# Patient Record
Sex: Female | Born: 1955
Health system: Southern US, Community
[De-identification: ages and names within clinical notes are randomized; demographics above are authoritative.]

## PROBLEM LIST (undated history)

## (undated) DIAGNOSIS — M810 Age-related osteoporosis without current pathological fracture: Secondary | ICD-10-CM

## (undated) DIAGNOSIS — K219 Gastro-esophageal reflux disease without esophagitis: Secondary | ICD-10-CM

## (undated) DIAGNOSIS — T7840XA Allergy, unspecified, initial encounter: Secondary | ICD-10-CM

## (undated) DIAGNOSIS — M1990 Unspecified osteoarthritis, unspecified site: Secondary | ICD-10-CM

## (undated) DIAGNOSIS — I10 Essential (primary) hypertension: Secondary | ICD-10-CM

## (undated) DIAGNOSIS — I219 Acute myocardial infarction, unspecified: Secondary | ICD-10-CM

## (undated) DIAGNOSIS — E785 Hyperlipidemia, unspecified: Secondary | ICD-10-CM

## (undated) DIAGNOSIS — G4730 Sleep apnea, unspecified: Secondary | ICD-10-CM

## (undated) DIAGNOSIS — C801 Malignant (primary) neoplasm, unspecified: Secondary | ICD-10-CM

## (undated) HISTORY — PX: TONSILLECTOMY: SUR1361

## (undated) HISTORY — DX: Allergy, unspecified, initial encounter: T78.40XA

## (undated) HISTORY — DX: Sleep apnea, unspecified: G47.30

## (undated) HISTORY — PX: DILATION AND CURETTAGE OF UTERUS: SHX78

## (undated) HISTORY — DX: Malignant (primary) neoplasm, unspecified: C80.1

## (undated) HISTORY — PX: FRACTURE SURGERY: SHX138

## (undated) HISTORY — DX: Unspecified osteoarthritis, unspecified site: M19.90

## (undated) HISTORY — DX: Age-related osteoporosis without current pathological fracture: M81.0

## (undated) HISTORY — DX: Gastro-esophageal reflux disease without esophagitis: K21.9

## (undated) HISTORY — PX: SKIN CANCER EXCISION: SHX779

---
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
## 1997-02-10 HISTORY — PX: LEEP: SHX91

## 2004-06-18 ENCOUNTER — Ambulatory Visit: Payer: Self-pay | Admitting: Family Medicine

## 2006-03-19 ENCOUNTER — Ambulatory Visit: Payer: Self-pay | Admitting: Family Medicine

## 2006-05-07 ENCOUNTER — Ambulatory Visit: Payer: Self-pay | Admitting: Gastroenterology

## 2007-03-23 ENCOUNTER — Ambulatory Visit: Payer: Self-pay | Admitting: Family Medicine

## 2008-10-31 ENCOUNTER — Ambulatory Visit: Payer: Self-pay | Admitting: Family Medicine

## 2009-02-10 HISTORY — PX: CARDIAC CATHETERIZATION: SHX172

## 2009-11-06 ENCOUNTER — Ambulatory Visit: Payer: Self-pay

## 2010-01-16 ENCOUNTER — Emergency Department: Payer: Self-pay | Admitting: Emergency Medicine

## 2010-01-16 ENCOUNTER — Ambulatory Visit: Payer: Self-pay | Admitting: Internal Medicine

## 2010-01-16 DIAGNOSIS — I252 Old myocardial infarction: Secondary | ICD-10-CM | POA: Insufficient documentation

## 2010-02-12 ENCOUNTER — Ambulatory Visit: Payer: Self-pay | Admitting: Internal Medicine

## 2010-12-10 ENCOUNTER — Ambulatory Visit: Payer: Self-pay | Admitting: Family Medicine

## 2011-12-17 ENCOUNTER — Ambulatory Visit: Payer: Self-pay | Admitting: Family Medicine

## 2013-01-27 ENCOUNTER — Ambulatory Visit: Payer: Self-pay | Admitting: Family Medicine

## 2014-04-12 ENCOUNTER — Ambulatory Visit: Payer: Self-pay | Admitting: Family Medicine

## 2014-08-09 DIAGNOSIS — O039 Complete or unspecified spontaneous abortion without complication: Secondary | ICD-10-CM | POA: Insufficient documentation

## 2014-08-09 DIAGNOSIS — E7800 Pure hypercholesterolemia, unspecified: Secondary | ICD-10-CM | POA: Insufficient documentation

## 2014-08-09 DIAGNOSIS — K921 Melena: Secondary | ICD-10-CM | POA: Insufficient documentation

## 2014-08-09 DIAGNOSIS — M1990 Unspecified osteoarthritis, unspecified site: Secondary | ICD-10-CM | POA: Insufficient documentation

## 2014-08-09 DIAGNOSIS — R195 Other fecal abnormalities: Secondary | ICD-10-CM | POA: Insufficient documentation

## 2014-08-09 DIAGNOSIS — I25118 Atherosclerotic heart disease of native coronary artery with other forms of angina pectoris: Secondary | ICD-10-CM | POA: Insufficient documentation

## 2014-08-09 DIAGNOSIS — E559 Vitamin D deficiency, unspecified: Secondary | ICD-10-CM | POA: Insufficient documentation

## 2014-08-09 DIAGNOSIS — E669 Obesity, unspecified: Secondary | ICD-10-CM | POA: Insufficient documentation

## 2014-08-09 DIAGNOSIS — L659 Nonscarring hair loss, unspecified: Secondary | ICD-10-CM | POA: Insufficient documentation

## 2014-08-09 DIAGNOSIS — I10 Essential (primary) hypertension: Secondary | ICD-10-CM | POA: Insufficient documentation

## 2014-08-09 DIAGNOSIS — I2510 Atherosclerotic heart disease of native coronary artery without angina pectoris: Secondary | ICD-10-CM | POA: Insufficient documentation

## 2014-11-17 ENCOUNTER — Ambulatory Visit (INDEPENDENT_AMBULATORY_CARE_PROVIDER_SITE_OTHER): Payer: 59 | Admitting: Physician Assistant

## 2014-11-17 ENCOUNTER — Encounter: Payer: Self-pay | Admitting: Physician Assistant

## 2014-11-17 VITALS — BP 102/60 | HR 78 | Temp 98.2°F | Resp 16 | Wt 153.8 lb

## 2014-11-17 DIAGNOSIS — G479 Sleep disorder, unspecified: Secondary | ICD-10-CM | POA: Insufficient documentation

## 2014-11-17 DIAGNOSIS — I5181 Takotsubo syndrome: Secondary | ICD-10-CM | POA: Diagnosis not present

## 2014-11-17 DIAGNOSIS — E7800 Pure hypercholesterolemia, unspecified: Secondary | ICD-10-CM | POA: Insufficient documentation

## 2014-11-17 DIAGNOSIS — I10 Essential (primary) hypertension: Secondary | ICD-10-CM | POA: Diagnosis not present

## 2014-11-17 DIAGNOSIS — Z23 Encounter for immunization: Secondary | ICD-10-CM

## 2014-11-17 DIAGNOSIS — I209 Angina pectoris, unspecified: Secondary | ICD-10-CM | POA: Insufficient documentation

## 2014-11-17 DIAGNOSIS — Z8679 Personal history of other diseases of the circulatory system: Secondary | ICD-10-CM | POA: Insufficient documentation

## 2014-11-17 DIAGNOSIS — R0789 Other chest pain: Secondary | ICD-10-CM | POA: Insufficient documentation

## 2014-11-17 MED ORDER — SIMVASTATIN 40 MG PO TABS: 20.0000 mg | ORAL_TABLET | Freq: Every day | ORAL | Status: DC

## 2014-11-17 NOTE — Progress Notes (Signed)
Patient: Katherine White Female    DOB: [birth year]   59 y.o.   MRN: [id number] Visit Date: 11/17/2014  Today's Provider: Mar Daring, PA-C   Chief Complaint  Patient presents with  . Hypertension  . Hyperlipidemia   Subjective:    HPI  Hypertension, follow-up:  BP Readings from Last 3 Encounters:  11/17/14 102/60  05/16/14 100/62    She was last seen for hypertension 6 months ago.  BP at that visit was 100/62. Management since that visit includes none. She reports good compliance with treatment. She is not having side effects.  She is exercising, walks 3-4 times a week. She is adherent to low salt diet.   Outside blood pressures are has not checked. She is experiencing none.  Patient denies none.        Weight trend: fluctuating a bit Wt Readings from Last 3 Encounters:  11/17/14 153 lb 12.8 oz (69.763 kg)  05/16/14 149 lb 12.8 oz (67.949 kg)    Current diet: in general, a "healthy" diet    She also is here today to follow-up her hypercholesterolemia. Labs in April were within normal limits. She is currently taking simvastatin 40 mg. She is taking half a tab nightly.  She also has a history of Takotsubo cardiomyopathy. She denies any chest pain, shortness of breath, dyspnea on exertion, orthopnea and lower extremity edema. She is currently controlled on carvedilol and losartan. Blood pressure today was stable. She is followed by Dr. Edd Arbour cardiology. She was seen last week for her annual visit. Everything was normal.  She also has a complaint of trouble sleeping. She was given Ambien 5 mg by Dr. Clayborn Bigness, which she did not fill. She is interested in trying to improve her sleep hygiene and take natural sleep aids to see if this will help her with her sleep before taking a prescription sleep aid. She states her biggest complaint is difficulty falling asleep. She states her thoughts, or worrying and stress are reasons why she can't fall  asleep. She also states that she use to have discomfort in her arms and legs where she could not get comfortable. She has bought a new pillow and states that this is decreasing but that it is still present on some nights. She has been going through a lot of situational stress recently. Her mother-in-law all recently passed away on 2022-10-27, she has been helping to take care of her father-in-law since this. She also keeps her grandson who is 18 months old. As well as having her father in failing health and she constantly worries about him as well. ------------------------------------------------------------------------      Allergies  Allergen Reactions  . Aspirin     GI upset (High dosage) Can take 81 mg  . Codeine    Previous Medications   ASPIRIN 81 MG TABLET    Take 1 tablet by mouth daily.   AZELAIC ACID 15 % CREAM    1 application daily.   CARVEDILOL (COREG) 3.125 MG TABLET    Take 1 tablet by mouth daily.   CHOLECALCIFEROL (VITAMIN D) 2000 UNITS CAPS    Take 1 capsule by mouth daily.   IVERMECTIN (SOOLANTRA) 1 % CREA    Apply topically once daily.   LORATADINE (CLARITIN REDITABS) 10 MG DISSOLVABLE TABLET    Take 1 tablet by mouth daily.   LOSARTAN (COZAAR) 25 MG TABLET    Take 1 tablet by mouth daily.   MAGNESIUM CITRATE 100  MG TABS    Take 1 tablet by mouth daily.   METRONIDAZOLE (METROGEL) 1 % GEL    1 application daily.   MULTIPLE VITAMIN PO    Take 1 tablet by mouth daily.   NITROGLYCERIN (NITROSTAT) 0.4 MG SL TABLET    Place 1 tablet under the tongue as needed.   PYRIDOXINE (B6 NATURAL) 100 MG TABLET    Take 1 tablet by mouth daily.   SIMVASTATIN (ZOCOR) 40 MG TABLET    Take 0.5 tablets by mouth daily.   TETRAHYDROZOLINE-ZINC (EYE DROPS RELIEF) 0.05-0.25 % OPHTHALMIC SOLUTION    Apply 1 drop to eye daily.   ZOLPIDEM (AMBIEN) 5 MG TABLET    Take by mouth.    Review of Systems  Constitutional: Negative.   HENT: Negative.   Eyes: Negative.   Respiratory: Negative.     Cardiovascular: Negative for chest pain, palpitations and leg swelling.  Gastrointestinal: Negative.   Endocrine: Negative.   Genitourinary: Negative.   Musculoskeletal: Positive for myalgias (occasional at night) and neck stiffness (improving with new pillow). Negative for back pain and arthralgias.  Skin: Color change: sometimes.  Allergic/Immunologic: Negative.   Neurological: Negative.   Hematological: Negative.   Psychiatric/Behavioral: Positive for sleep disturbance.    Social History  Substance Use Topics  . Smoking status: Former Smoker -- 1 years  . Smokeless tobacco: Not on file  . Alcohol Use: 0.0 oz/week    0 Standard drinks or equivalent per week     Comment: OCCASIONALLY   Objective:   BP 102/60 mmHg  Pulse 78  Temp(Src) 98.2 F (36.8 C) (Oral)  Resp 16  Wt 153 lb 12.8 oz (69.763 kg)  Physical Exam  Constitutional: She is oriented to person, place, and time. She appears well-developed and well-nourished. No distress.  Cardiovascular: Normal rate, regular rhythm and normal heart sounds.  Exam reveals no gallop and no friction rub.   No murmur heard. Pulmonary/Chest: Effort normal and breath sounds normal. No respiratory distress. She has no wheezes. She has no rales.  Neurological: She is alert and oriented to person, place, and time.  Skin: She is not diaphoretic.  Psychiatric: She has a normal mood and affect. Her behavior is normal. Judgment and thought content normal.  Vitals reviewed.       Assessment & Plan:     1. Hypercholesterolemia without hypertriglyceridemia Labs in April were stable. She is to continue the simvastatin in 40 mg taking a half tab nightly. I will recheck lipid panel. Follow-up pending results or in 6 months with her annual physical exam if labs are normal. - Lipid panel - CBC with Differential/Platelet - simvastatin (ZOCOR) 40 MG tablet; Take 0.5 tablets (20 mg total) by mouth daily.  Dispense: 30 tablet; Refill: 6  2.  Takotsubo cardiomyopathy Followed by Dr. Edd Arbour cardiology.  Currently stable on Cardizem and losartan. I will check labs and follow-up pending labs or in 6 months with her annual physical exam if her labs are stable. - CBC with Differential/Platelet - Comprehensive metabolic panel  3. Essential hypertension Blood pressures are normal. I will check labs and follow-up pending labs or in 6 months at her annual physical exam if labs are stable. - CBC with Differential/Platelet - Comprehensive metabolic panel  4. Trouble in sleeping She is had trouble sleeping for a while but just now brings it up in the office visit today since she has not had much improvement. She was given Ambien 5 mg by Dr. Clayborn Bigness. She did  not fill this prescription. We discussed in detail sleep hygiene and sleep meditation techniques. She is going to try some of these methods and see if they help her sleep. We also discussed the use of melatonin. She may try the melatonin. I also advised her that melatonin may take a while for it to be effective so if she tries it to continue to take it for 2-4 weeks to see if it helps before discontinuing. She is to call the office if she has any worsening insomnia. If not I will see her in 6 months with her annual physical and we will discuss it at that visit.  5. Need for influenza vaccination Flu vaccine was given today without complication. - Flu Vaccine QUAD 36+ mos IM       Mar Daring, PA-C  [place]

## 2014-11-17 NOTE — Patient Instructions (Signed)
Insomnia Insomnia is a sleep disorder that makes it difficult to fall asleep or to stay asleep. Insomnia can cause tiredness (fatigue), low energy, difficulty concentrating, mood swings, and poor performance at work or school.  There are three different ways to classify insomnia:  Difficulty falling asleep.  Difficulty staying asleep.  Waking up too early in the morning. Any type of insomnia can be long-term (chronic) or short-term (acute). Both are common. Short-term insomnia usually lasts for three months or less. Chronic insomnia occurs at least three times a week for longer than three months. CAUSES  Insomnia may be caused by another condition, situation, or substance, such as:  Anxiety.  Certain medicines.  Gastroesophageal reflux disease (GERD) or other gastrointestinal conditions.  Asthma or other breathing conditions.  Restless legs syndrome, sleep apnea, or other sleep disorders.  Chronic pain.  Menopause. This may include hot flashes.  Stroke.  Abuse of alcohol, tobacco, or illegal drugs.  Depression.  Caffeine.   Neurological disorders, such as Alzheimer disease.  An overactive thyroid (hyperthyroidism). The cause of insomnia may not be known. RISK FACTORS Risk factors for insomnia include:  Gender. Women are more commonly affected than men.  Age. Insomnia is more common as you get older.  Stress. This may involve your professional or personal life.  Income. Insomnia is more common in people with lower income.  Lack of exercise.   Irregular work schedule or night shifts.  Traveling between different time zones. SIGNS AND SYMPTOMS If you have insomnia, trouble falling asleep or trouble staying asleep is the main symptom. This may lead to other symptoms, such as:  Feeling fatigued.  Feeling nervous about going to sleep.  Not feeling rested in the morning.  Having trouble concentrating.  Feeling irritable, anxious, or depressed. TREATMENT    Treatment for insomnia depends on the cause. If your insomnia is caused by an underlying condition, treatment will focus on addressing the condition. Treatment may also include:   Medicines to help you sleep.  Counseling or therapy.  Lifestyle adjustments. HOME CARE INSTRUCTIONS   Take medicines only as directed by your health care provider.  Keep regular sleeping and waking hours. Avoid naps.  Keep a sleep diary to help you and your health care provider figure out what could be causing your insomnia. Include:   When you sleep.  When you wake up during the night.  How well you sleep.   How rested you feel the next day.  Any side effects of medicines you are taking.  What you eat and drink.   Make your bedroom a comfortable place where it is easy to fall asleep:  Put up shades or special blackout curtains to block light from outside.  Use a white noise machine to block noise.  Keep the temperature cool.   Exercise regularly as directed by your health care provider. Avoid exercising right before bedtime.  Use relaxation techniques to manage stress. Ask your health care provider to suggest some techniques that may work well for you. These may include:  Breathing exercises.  Routines to release muscle tension.  Visualizing peaceful scenes.  Cut back on alcohol, caffeinated beverages, and cigarettes, especially close to bedtime. These can disrupt your sleep.  Do not overeat or eat spicy foods right before bedtime. This can lead to digestive discomfort that can make it hard for you to sleep.  Limit screen use before bedtime. This includes:  Watching TV.  Using your smartphone, tablet, and computer.  Stick to a routine.  This can help you fall asleep faster. Try to do a quiet activity, brush your teeth, and go to bed at the same time each night.  Get out of bed if you are still awake after 15 minutes of trying to sleep. Keep the lights down, but try reading or  doing a quiet activity. When you feel sleepy, go back to bed.  Make sure that you drive carefully. Avoid driving if you feel very sleepy.  Keep all follow-up appointments as directed by your health care provider. This is important. SEEK MEDICAL CARE IF:   You are tired throughout the day or have trouble in your daily routine due to sleepiness.  You continue to have sleep problems or your sleep problems get worse. SEEK IMMEDIATE MEDICAL CARE IF:   You have serious thoughts about hurting yourself or someone else.   This information is not intended to replace advice given to you by your health care provider. Make sure you discuss any questions you have with your health care provider.   Document Released: 01/25/2000 Document Revised: 10/18/2014 Document Reviewed: 10/28/2013 Elsevier Interactive Patient Education 2016 [place].  High Cholesterol High cholesterol refers to having a high level of cholesterol in your blood. Cholesterol is a white, waxy, fat-like protein that your body needs in small amounts. Your liver makes all the cholesterol you need. Excess cholesterol comes from the food you eat. Cholesterol travels in your bloodstream through your blood vessels. If you have high cholesterol, deposits (plaque) may build up on the walls of your blood vessels. This makes the arteries narrower and stiffer. Plaque increases your risk of heart attack and stroke. Work with your health care provider to keep your cholesterol levels in a healthy range. RISK FACTORS Several things can make you more likely to have high cholesterol. These include:   Eating foods high in animal fat (saturated fat) or cholesterol.  Being overweight.  Not getting enough exercise.  Having a family history of high cholesterol. SIGNS AND SYMPTOMS High cholesterol does not cause symptoms. DIAGNOSIS  Your health care provider can do a blood test to check whether you have high cholesterol. If you are older than 20,  your health care provider may check your cholesterol every 4-6 years. You may be checked more often if you already have high cholesterol or other risk factors for heart disease. The blood test for cholesterol measures the following:  Bad cholesterol (LDL cholesterol). This is the type of cholesterol that causes heart disease. This number should be less than 100.  Good cholesterol (HDL cholesterol). This type helps protect against heart disease. A healthy level of HDL cholesterol is 60 or higher.  Total cholesterol. This is the combined number of LDL cholesterol and HDL cholesterol. A healthy number is less than 200. TREATMENT  High cholesterol can be treated with diet changes, lifestyle changes, and medicine.   Diet changes may include eating more whole grains, fruits, vegetables, nuts, and fish. You may also have to cut back on red meat and foods with a lot of added sugar.  Lifestyle changes may include getting at least 40 minutes of aerobic exercise three times a week. Aerobic exercises include walking, biking, and swimming. Aerobic exercise along with a healthy diet can help you maintain a healthy weight. Lifestyle changes may also include quitting smoking.  If diet and lifestyle changes are not enough to lower your cholesterol, your health care provider may prescribe a statin medicine. This medicine has been shown to lower cholesterol and  also lower the risk of heart disease. HOME CARE INSTRUCTIONS  Only take over-the-counter or prescription medicines as directed by your health care provider.   Follow a healthy diet as directed by your health care provider. For instance:   Eat chicken (without skin), fish, veal, shellfish, ground [place] breast, and round or loin cuts of red meat.  Do not eat fried foods and fatty meats, such as hot dogs and salami.   Eat plenty of fruits, such as apples.   Eat plenty of vegetables, such as broccoli, potatoes, and carrots.   Eat beans, peas, and  lentils.   Eat grains, such as barley, rice, couscous, and bulgur wheat.   Eat pasta without cream sauces.   Use skim or nonfat milk and low-fat or nonfat yogurt and cheeses. Do not eat or drink whole milk, cream, ice cream, egg yolks, and hard cheeses.   Do not eat stick margarine or tub margarines that contain trans fats (also called partially hydrogenated oils).   Do not eat cakes, cookies, crackers, or other baked goods that contain trans fats.   Do not eat saturated tropical oils, such as coconut and palm oil.   Exercise as directed by your health care provider. Increase your activity level with activities such as gardening or walking.   Keep all follow-up appointments.  SEEK MEDICAL CARE IF:  You are struggling to maintain a healthy diet or weight.  You need help starting an exercise program.  You need help to stop smoking. SEEK IMMEDIATE MEDICAL CARE IF:  You have chest pain.  You have trouble breathing.   This information is not intended to replace advice given to you by your health care provider. Make sure you discuss any questions you have with your health care provider.   Document Released: 01/27/2005 Document Revised: 02/17/2014 Document Reviewed: 11/19/2012 Elsevier Interactive Patient Education [employer].

## 2014-11-18 LAB — CBC WITH DIFFERENTIAL/PLATELET
BASOS: 1 %
Basophils Absolute: 0.1 10*3/uL (ref 0.0–0.2)
EOS (ABSOLUTE): 0.3 10*3/uL (ref 0.0–0.4)
EOS: 4 %
HEMOGLOBIN: 16 g/dL — AB (ref 11.1–15.9)
Hematocrit: 48 % — ABNORMAL HIGH (ref 34.0–46.6)
IMMATURE GRANS (ABS): 0 10*3/uL (ref 0.0–0.1)
Immature Granulocytes: 0 %
LYMPHS: 32 %
Lymphocytes Absolute: 2.3 10*3/uL (ref 0.7–3.1)
MCH: 28.8 pg (ref 26.6–33.0)
MCHC: 33.3 g/dL (ref 31.5–35.7)
MCV: 87 fL (ref 79–97)
MONOCYTES: 8 %
Monocytes Absolute: 0.6 10*3/uL (ref 0.1–0.9)
NEUTROS ABS: 4 10*3/uL (ref 1.4–7.0)
Neutrophils: 55 %
Platelets: 242 10*3/uL (ref 150–379)
RBC: 5.55 x10E6/uL — ABNORMAL HIGH (ref 3.77–5.28)
RDW: 14 % (ref 12.3–15.4)
WBC: 7.1 10*3/uL (ref 3.4–10.8)

## 2014-11-18 LAB — LIPID PANEL
CHOLESTEROL TOTAL: 172 mg/dL (ref 100–199)
Chol/HDL Ratio: 2.8 ratio units (ref 0.0–4.4)
HDL: 62 mg/dL (ref >39)
LDL Calculated: 89 mg/dL (ref 0–99)
TRIGLYCERIDES: 106 mg/dL (ref 0–149)
VLDL Cholesterol Cal: 21 mg/dL (ref 5–40)

## 2014-11-18 LAB — COMPREHENSIVE METABOLIC PANEL WITH GFR
ALT: 35 IU/L — ABNORMAL HIGH (ref 0–32)
AST: 21 IU/L (ref 0–40)
Albumin/Globulin Ratio: 2.2 (ref 1.1–2.5)
Albumin: 4.6 g/dL (ref 3.5–5.5)
Alkaline Phosphatase: 97 IU/L (ref 39–117)
BUN/Creatinine Ratio: 18 (ref 9–23)
BUN: 14 mg/dL (ref 6–24)
Bilirubin Total: 0.4 mg/dL (ref 0.0–1.2)
CO2: 22 mmol/L (ref 18–29)
Calcium: 9.8 mg/dL (ref 8.7–10.2)
Chloride: 105 mmol/L (ref 97–108)
Creatinine, Ser: 0.77 mg/dL (ref 0.57–1.00)
GFR calc Af Amer: 98 mL/min/1.73 (ref >59)
GFR calc non Af Amer: 85 mL/min/1.73 (ref >59)
Globulin, Total: 2.1 g/dL (ref 1.5–4.5)
Glucose: 94 mg/dL (ref 65–99)
Potassium: 4.6 mmol/L (ref 3.5–5.2)
Sodium: 142 mmol/L (ref 134–144)
Total Protein: 6.7 g/dL (ref 6.0–8.5)

## 2014-11-20 ENCOUNTER — Telehealth: Payer: Self-pay

## 2014-11-20 NOTE — Telephone Encounter (Signed)
-----   Message from Mar Daring, PA-C sent at 11/20/2014  9:38 AM EDT ----- All labs are stable and WNL.

## 2014-11-20 NOTE — Telephone Encounter (Signed)
Advised pt as directed. Pt verbalized fully understanding.  Thanks,   

## 2015-01-17 ENCOUNTER — Other Ambulatory Visit: Payer: Self-pay | Admitting: Physician Assistant

## 2015-01-17 DIAGNOSIS — E7800 Pure hypercholesterolemia, unspecified: Secondary | ICD-10-CM

## 2015-02-28 ENCOUNTER — Telehealth: Payer: Self-pay | Admitting: Physician Assistant

## 2015-02-28 DIAGNOSIS — J069 Acute upper respiratory infection, unspecified: Secondary | ICD-10-CM

## 2015-02-28 MED ORDER — AZITHROMYCIN 250 MG PO TABS: ORAL_TABLET | ORAL | Status: DC

## 2015-02-28 NOTE — Telephone Encounter (Signed)
Please review. Thanks!  

## 2015-02-28 NOTE — Telephone Encounter (Signed)
Will try Zpak. Sent to [place]. If no improvement she will need to be seen.  Thanks.

## 2015-02-28 NOTE — Telephone Encounter (Signed)
Pt states she has had a sore throat, ears stopped up and sinus congestion with thick white mucus/a little green for 2 weeks.  Pt has been taking over the counter medication.  Pt is requesting a Rx if possible.  Pt takes care of has her 26 month old grandson and does not want to bring him in the office.  [place].  [id number]

## 2015-02-28 NOTE — Telephone Encounter (Signed)
Advised patient as below.  

## 2015-05-18 ENCOUNTER — Encounter: Payer: 59 | Admitting: Physician Assistant

## 2015-06-05 ENCOUNTER — Encounter: Payer: Self-pay | Admitting: Physician Assistant

## 2015-06-05 ENCOUNTER — Ambulatory Visit (INDEPENDENT_AMBULATORY_CARE_PROVIDER_SITE_OTHER): Payer: 59 | Admitting: Physician Assistant

## 2015-06-05 VITALS — BP 100/70 | HR 72 | Temp 97.6°F | Resp 16 | Ht 60.0 in | Wt 154.4 lb

## 2015-06-05 DIAGNOSIS — E7800 Pure hypercholesterolemia, unspecified: Secondary | ICD-10-CM

## 2015-06-05 DIAGNOSIS — E559 Vitamin D deficiency, unspecified: Secondary | ICD-10-CM | POA: Diagnosis not present

## 2015-06-05 DIAGNOSIS — I10 Essential (primary) hypertension: Secondary | ICD-10-CM | POA: Diagnosis not present

## 2015-06-05 DIAGNOSIS — Z0000 Encounter for general adult medical examination without abnormal findings: Secondary | ICD-10-CM | POA: Diagnosis not present

## 2015-06-05 DIAGNOSIS — Z1239 Encounter for other screening for malignant neoplasm of breast: Secondary | ICD-10-CM | POA: Diagnosis not present

## 2015-06-05 NOTE — Patient Instructions (Signed)

## 2015-06-05 NOTE — Progress Notes (Signed)
Patient: Katherine White, Female    DOB: 1955/07/14, 60 y.o.   MRN: [id number] Visit Date: 06/05/2015  Today's Provider: Mar Daring, PA-C   Chief Complaint  Patient presents with  . Annual Exam   Subjective:    Annual physical exam Katherine White is a 60 y.o. female who presents today for health maintenance and complete physical. She feels fairly well. She reports exercising-Pilates and Yoga 5 x a week. She reports she is sleeping well at least six hours. She have had some stress since the last time she saw [place]. Her mother-in-law passed away in 2022-10-17 and her Daddy passed away this month on the 18-May-2022.   Lipid/Cholesterol, Follow-up:   Last seen for this 6 months ago.  Management changes since that visit include none.To continue Simvastatin 40 mg-takes 1/2 tab nightly.She is followed by Dr. Edd Arbour cardiology. He prescribes her BP and cholesterol medications. . Last Lipid Panel:    Component Value Date/Time   CHOL 172 11/17/2014 0938   TRIG 106 11/17/2014 0938   HDL 62 11/17/2014 0938   CHOLHDL 2.8 11/17/2014 0938   LDLCALC 89 11/17/2014 0938    Risk factors for vascular disease include hypertension and Cardiovascular and Mediastinum, Takotsubo cardiomyopathy.  She reports good compliance with treatment. She is not having side effects.  Current symptoms include none and have been stable. Weight trend: stable Current exercise: Yoga and Pilates.  Wt Readings from Last 3 Encounters:  06/05/15 154 lb 6.4 oz (70.035 kg)  11/17/14 153 lb 12.8 oz (69.763 kg)  05/16/14 149 lb 12.8 oz (67.949 kg)    ------------------------------------------------------------------- Takotsubo Cardiomyopathy: Patient is followed by Dr. Edd Arbour, [place]: She is here for 6 months follow-up on her sleep. Last visit she was having trouble falling asleep and waking up during the night. She is not taking anything for sleep. She  is doing better and states she has good nights and bad nights. She does feel that since her father passed she has not been worrying about him so that has helped some. She is getting about 6 hours of sleep.  Last PCP: 05/16/14 Pap: 05/18/14 Normal. Repeat in 5 years. Mammogram: 04/12/14 BI-RADS Category 1: Negative Colonoscopy:05/07/2006 Enlarged IC valve. -----------------------------------------------------------------   Review of Systems  Constitutional: Negative.   HENT: Negative.   Eyes: Positive for itching.  Respiratory: Negative.   Cardiovascular: Negative.   Gastrointestinal: Negative.   Endocrine: Negative.   Genitourinary: Negative.   Musculoskeletal: Positive for arthralgias and neck stiffness.  Skin: Negative.   Allergic/Immunologic: Negative.   Neurological: Negative.   Hematological: Negative.   Psychiatric/Behavioral: Negative.     Social History      She  reports that she has quit smoking. She does not have any smokeless tobacco history on file. She reports that she drinks alcohol. She reports that she does not use illicit drugs.       Social History   Social History  . Marital Status: Married    Spouse Name: N/A  . Number of Children: N/A  . Years of Education: N/A   Social History Main Topics  . Smoking status: Former Smoker -- 1 years  . Smokeless tobacco: None  . Alcohol Use: 0.0 oz/week    0 Standard drinks or equivalent per week     Comment: OCCASIONALLY  . Drug Use: No  . Sexual Activity: Not Asked   Other Topics Concern  . None   Social History Narrative  History reviewed. No pertinent past medical history.   Patient Active Problem List   Diagnosis Date Noted  . Personal history of other diseases of the circulatory system 11/17/2014  . Atypical chest pain 11/17/2014  . Angina pectoris ([place]) 11/17/2014  . Takotsubo cardiomyopathy 11/17/2014  . Trouble in sleeping 11/17/2014  . Abortion, spontaneous 08/09/2014  . Arthritis  08/09/2014  . Arteriosclerosis of coronary artery 08/09/2014  . Alopecia 08/09/2014  . BP (high blood pressure) 08/09/2014  . Adiposity 08/09/2014  . Nonspecific abnormal finding in stool contents 08/09/2014  . Hypercholesterolemia without hypertriglyceridemia 08/09/2014  . Blood in stool 08/09/2014  . Avitaminosis D 08/09/2014  . Myocardial infarction [facility]) 01/16/2010    Past Surgical History  Procedure Laterality Date  . Leep  1999  . Tonsillectomy and adenoidectomy  1964  . Cardiac catheterization  2011  . Dilation and curettage of uterus  1980, 1981    AFTER 2 MISCARRIAGES    Family History        Family Status  Relation Status Death Age  . Brother Alive   . Maternal Aunt Alive   . Paternal Uncle Deceased   . Maternal Grandmother Deceased   . Paternal Grandmother Deceased   . Brother Alive   . Mother Deceased   . Father Alive   . Maternal Grandfather Deceased         Her family history includes Breast cancer in her maternal aunt, mother, and other; Coronary artery disease in her mother; Diabetes in her maternal grandmother, other, and paternal uncle; Heart attack in her maternal grandfather; Heart disease in her father, mother, and other; Hyperlipidemia in her other; Hypertension in her brother; Liver cancer in her mother; Pneumonia in her paternal grandmother.    Allergies  Allergen Reactions  . Aspirin     GI upset (High dosage) Can take 81 mg  . Codeine     Previous Medications   ASPIRIN 81 MG TABLET    Take 1 tablet by mouth daily.   AZELAIC ACID 15 % CREAM    1 application daily.   AZITHROMYCIN (ZITHROMAX) 250 MG TABLET    Take 2 tablets PO on day one, and one tablet PO daily thereafter until completed.   CARVEDILOL (COREG) 3.125 MG TABLET    Take 1 tablet by mouth daily.   CHOLECALCIFEROL (VITAMIN D) 2000 UNITS CAPS    Take 1 capsule by mouth daily.   IVERMECTIN (SOOLANTRA) 1 % CREA    Apply topically once daily.   LORATADINE (CLARITIN REDITABS) 10 MG  DISSOLVABLE TABLET    Take 1 tablet by mouth daily.   LOSARTAN (COZAAR) 25 MG TABLET    Take 1 tablet by mouth daily.   MAGNESIUM CITRATE 100 MG TABS    Take 1 tablet by mouth daily. Reported on 06/05/2015   METRONIDAZOLE (METROGEL) 1 % GEL    1 application daily. Reported on 06/05/2015   MULTIPLE VITAMIN PO    Take 1 tablet by mouth daily.   NITROGLYCERIN (NITROSTAT) 0.4 MG SL TABLET    Place 1 tablet under the tongue as needed. Reported on 06/05/2015   PYRIDOXINE (B6 NATURAL) 100 MG TABLET    Take 1 tablet by mouth daily.   SIMVASTATIN (ZOCOR) 40 MG TABLET    TAKE ONE-HALF TABLET DAILY   TETRAHYDROZOLINE-ZINC (EYE DROPS RELIEF) 0.05-0.25 % OPHTHALMIC SOLUTION    Apply 1 drop to eye daily.    Patient Care Team: Mar Daring, PA-C as PCP - General (Physician Assistant)  Objective:   Vitals: BP 100/70 mmHg  Pulse 72  Temp(Src) 97.6 F (36.4 C) (Oral)  Resp 16  Ht 5' (1.524 m)  Wt 154 lb 6.4 oz (70.035 kg)  BMI 30.15 kg/m2   Physical Exam  Constitutional: She is oriented to person, place, and time. She appears well-developed and well-nourished. No distress.  HENT:  Head: Normocephalic and atraumatic.  Right Ear: Hearing, tympanic membrane, external ear and ear canal normal.  Left Ear: Hearing, tympanic membrane, external ear and ear canal normal.  Nose: Nose normal.  Mouth/Throat: Uvula is midline, oropharynx is clear and moist and mucous membranes are normal. No oropharyngeal exudate, posterior oropharyngeal edema or posterior oropharyngeal erythema.  Eyes: Conjunctivae and EOM are normal. Pupils are equal, round, and reactive to light. Right eye exhibits no discharge. Left eye exhibits no discharge. No scleral icterus.  Neck: Normal range of motion. Neck supple. No JVD present. Carotid bruit is not present. No tracheal deviation present. No thyromegaly present.  Cardiovascular: Normal rate, regular rhythm, normal heart sounds and intact distal pulses.  Exam reveals no  gallop and no friction rub.   No murmur heard. Pulmonary/Chest: Effort normal and breath sounds normal. No respiratory distress. She has no wheezes. She has no rales. She exhibits no tenderness. Right breast exhibits no inverted nipple, no mass, no nipple discharge, no skin change and no tenderness. Left breast exhibits no inverted nipple, no mass, no nipple discharge, no skin change and no tenderness. Breasts are symmetrical.  Abdominal: Soft. Bowel sounds are normal. She exhibits no distension and no mass. There is no tenderness. There is no rebound and no guarding.  Musculoskeletal: Normal range of motion. She exhibits no edema or tenderness.  Lymphadenopathy:    She has no cervical adenopathy.  Neurological: She is alert and oriented to person, place, and time.  Skin: Skin is warm and dry. No rash noted. She is not diaphoretic.  Psychiatric: She has a normal mood and affect. Her behavior is normal. Judgment and thought content normal.  Vitals reviewed.    Depression Screen No flowsheet data found.    Assessment & Plan:     Routine Health Maintenance and Physical Exam  1. Annual physical exam Normal physical exam today. Will check labs as below and f/u pending lab results. If labs are stable and WNL she will not need to have these rechecked for one year at her next annual physical exam. She is to call the office in the meantime if she has any acute issue, questions or concerns. - CBC with Differential/Platelet - Comprehensive metabolic panel - Lipid panel - TSH - Vitamin D (25 hydroxy)  2. Breast cancer screening Breast exam today was normal. There is no family history of breast cancer. She does perform regular self breast exams. Mammogram was ordered as below. Information for [facility] was given to patient so she may schedule her mammogram at her convenience. - MM DIGITAL SCREENING BILATERAL; Future  3. Hypercholesterolemia without hypertriglyceridemia Stable on  Zocor. Will check labs as below and f/u pending lab results. - Lipid panel  4. Essential hypertension Stable on Coreg and Cozaar. Will check labs as below and f/u pending lab results. - CBC with Differential/Platelet - Comprehensive metabolic panel  5. Avitaminosis D Takes a multi-vitamin with Vit D. Has previously been on Rx dose vit D supplement. Will recheck Vit D as below and f/u pending results since it has been a while since this has been checked. - Vitamin D (25  hydroxy)   Exercise Activities and Dietary recommendations Goals    None      Immunization History  Administered Date(s) Administered  . Influenza,inj,Quad PF,36+ Mos 11/17/2014  . Tdap 03/04/2006, 12/29/2013  . Zoster 04/10/2011    Health Maintenance  Topic Date Due  . Hepatitis C Screening  1955-07-16  . HIV Screening  04/18/1971  . COLONOSCOPY  04/18/2006  . MAMMOGRAM  04/12/2015  . PAP SMEAR  05/18/2015  . INFLUENZA VACCINE  09/11/2015  . TETANUS/TDAP  12/30/2023      Discussed health benefits of physical activity, and encouraged her to engage in regular exercise appropriate for her age and condition.    --------------------------------------------------------------------

## 2015-06-06 ENCOUNTER — Telehealth: Payer: Self-pay

## 2015-06-06 LAB — COMPREHENSIVE METABOLIC PANEL
ALK PHOS: 98 IU/L (ref 39–117)
ALT: 36 IU/L — AB (ref 0–32)
AST: 26 IU/L (ref 0–40)
Albumin/Globulin Ratio: 2 (ref 1.2–2.2)
Albumin: 4.4 g/dL (ref 3.5–5.5)
BUN/Creatinine Ratio: 12 (ref 9–23)
BUN: 9 mg/dL (ref 6–24)
Bilirubin Total: 0.4 mg/dL (ref 0.0–1.2)
CO2: 22 mmol/L (ref 18–29)
CREATININE: 0.77 mg/dL (ref 0.57–1.00)
Calcium: 9.4 mg/dL (ref 8.7–10.2)
Chloride: 102 mmol/L (ref 96–106)
GFR calc Af Amer: 98 mL/min/{1.73_m2} (ref >59)
GFR calc non Af Amer: 85 mL/min/{1.73_m2} (ref >59)
GLUCOSE: 95 mg/dL (ref 65–99)
Globulin, Total: 2.2 g/dL (ref 1.5–4.5)
Potassium: 4.7 mmol/L (ref 3.5–5.2)
Sodium: 140 mmol/L (ref 134–144)
Total Protein: 6.6 g/dL (ref 6.0–8.5)

## 2015-06-06 LAB — CBC WITH DIFFERENTIAL/PLATELET
BASOS ABS: 0 10*3/uL (ref 0.0–0.2)
Basos: 1 %
EOS (ABSOLUTE): 0.3 10*3/uL (ref 0.0–0.4)
Eos: 5 %
HEMOGLOBIN: 15.2 g/dL (ref 11.1–15.9)
Hematocrit: 45.2 % (ref 34.0–46.6)
Immature Grans (Abs): 0 10*3/uL (ref 0.0–0.1)
Immature Granulocytes: 0 %
LYMPHS ABS: 2.1 10*3/uL (ref 0.7–3.1)
Lymphs: 33 %
MCH: 29.6 pg (ref 26.6–33.0)
MCHC: 33.6 g/dL (ref 31.5–35.7)
MCV: 88 fL (ref 79–97)
MONOCYTES: 7 %
MONOS ABS: 0.4 10*3/uL (ref 0.1–0.9)
Neutrophils Absolute: 3.4 10*3/uL (ref 1.4–7.0)
Neutrophils: 54 %
PLATELETS: 265 10*3/uL (ref 150–379)
RBC: 5.13 x10E6/uL (ref 3.77–5.28)
RDW: 14.1 % (ref 12.3–15.4)
WBC: 6.2 10*3/uL (ref 3.4–10.8)

## 2015-06-06 LAB — LIPID PANEL
CHOLESTEROL TOTAL: 181 mg/dL (ref 100–199)
Chol/HDL Ratio: 3.5 ratio units (ref 0.0–4.4)
HDL: 51 mg/dL (ref >39)
LDL CALC: 95 mg/dL (ref 0–99)
TRIGLYCERIDES: 176 mg/dL — AB (ref 0–149)
VLDL CHOLESTEROL CAL: 35 mg/dL (ref 5–40)

## 2015-06-06 LAB — TSH: TSH: 1.71 u[IU]/mL (ref 0.450–4.500)

## 2015-06-06 LAB — VITAMIN D 25 HYDROXY (VIT D DEFICIENCY, FRACTURES): Vit D, 25-Hydroxy: 32.9 ng/mL (ref 30.0–100.0)

## 2015-06-06 NOTE — Telephone Encounter (Signed)
-----   Message from Mar Daring, [place] sent at 06/06/2015  8:14 AM EDT ----- All labs are within normal limits and stable.  Triglycerides (part of cholesterol that is most closely related to diet) has increased from 106 to 176. Continue to limit saturated fats and foods high in cholesterol from diet. Will recheck at next annual physical. Thanks! -JB

## 2015-06-06 NOTE — Telephone Encounter (Signed)
Patient advised as directed below. Patient verbalized understanding.  

## 2015-07-20 ENCOUNTER — Ambulatory Visit: Payer: Self-pay

## 2015-08-01 ENCOUNTER — Other Ambulatory Visit: Payer: Self-pay | Admitting: Physician Assistant

## 2015-08-01 ENCOUNTER — Ambulatory Visit
Admission: RE | Admit: 2015-08-01 | Discharge: 2015-08-01 | Disposition: A | Discharge status: Home / Self Care | Payer: 59 | Source: Ambulatory Visit | Attending: Physician Assistant | Admitting: Physician Assistant

## 2015-08-01 ENCOUNTER — Telehealth: Payer: Self-pay

## 2015-08-01 DIAGNOSIS — Z1231 Encounter for screening mammogram for malignant neoplasm of breast: Secondary | ICD-10-CM | POA: Diagnosis not present

## 2015-08-01 DIAGNOSIS — Z1239 Encounter for other screening for malignant neoplasm of breast: Secondary | ICD-10-CM

## 2015-08-01 NOTE — Telephone Encounter (Signed)
LMTCB  Thanks,  -Briceida Rasberry 

## 2015-08-01 NOTE — Telephone Encounter (Signed)
-----   Message from Mar Daring, [place] sent at 08/01/2015 12:02 PM EDT ----- Normal mammogram. Repeat screening in one year.

## 2015-08-02 NOTE — Telephone Encounter (Signed)
Patient advised as directed below.  Thanks,  -Jennavecia Schwier 

## 2015-11-22 ENCOUNTER — Encounter: Payer: Self-pay | Admitting: Physician Assistant

## 2015-11-22 ENCOUNTER — Ambulatory Visit (INDEPENDENT_AMBULATORY_CARE_PROVIDER_SITE_OTHER): Payer: 59 | Admitting: Physician Assistant

## 2015-11-22 VITALS — BP 106/60 | HR 88 | Temp 98.7°F | Resp 16 | Wt 159.0 lb

## 2015-11-22 DIAGNOSIS — J069 Acute upper respiratory infection, unspecified: Secondary | ICD-10-CM | POA: Diagnosis not present

## 2015-11-22 DIAGNOSIS — J019 Acute sinusitis, unspecified: Secondary | ICD-10-CM

## 2015-11-22 MED ORDER — FLUTICASONE PROPIONATE 50 MCG/ACT NA SUSP: 2.0000 | Freq: Every day | NASAL | 0 refills | Status: DC

## 2015-11-22 NOTE — Patient Instructions (Signed)
Upper Respiratory Infection, Adult Most upper respiratory infections (URIs) are a viral infection of the air passages leading to the lungs. A URI affects the nose, throat, and upper air passages. The most common type of URI is nasopharyngitis and is typically referred to as "the common cold." URIs run their course and usually go away on their own. Most of the time, a URI does not require medical attention, but sometimes a bacterial infection in the upper airways can follow a viral infection. This is called a secondary infection. Sinus and middle ear infections are common types of secondary upper respiratory infections. Bacterial pneumonia can also complicate a URI. A URI can worsen asthma and chronic obstructive pulmonary disease (COPD). Sometimes, these complications can require emergency medical care and may be life threatening.  CAUSES Almost all URIs are caused by viruses. A virus is a type of germ and can spread from one person to another.  RISKS FACTORS You may be at risk for a URI if:   You smoke.   You have chronic heart or lung disease.  You have a weakened defense (immune) system.   You are very young or very old.   You have nasal allergies or asthma.  You work in crowded or poorly ventilated areas.  You work in health care facilities or schools. SIGNS AND SYMPTOMS  Symptoms typically develop 2-3 days after you come in contact with a cold virus. Most viral URIs last 7-10 days. However, viral URIs from the influenza virus (flu virus) can last 14-18 days and are typically more severe. Symptoms may include:   Runny or stuffy (congested) nose.   Sneezing.   Cough.   Sore throat.   Headache.   Fatigue.   Fever.   Loss of appetite.   Pain in your forehead, behind your eyes, and over your cheekbones (sinus pain).  Muscle aches.  DIAGNOSIS  Your health care provider may diagnose a URI by:  Physical exam.  Tests to check that your symptoms are not due to  another condition such as:  Strep throat.  Sinusitis.  Pneumonia.  Asthma. TREATMENT  A URI goes away on its own with time. It cannot be cured with medicines, but medicines may be prescribed or recommended to relieve symptoms. Medicines may help:  Reduce your fever.  Reduce your cough.  Relieve nasal congestion. HOME CARE INSTRUCTIONS   Take medicines only as directed by your health care provider.   Gargle warm saltwater or take cough drops to comfort your throat as directed by your health care provider.  Use a warm mist humidifier or inhale steam from a shower to increase air moisture. This may make it easier to breathe.  Drink enough fluid to keep your urine clear or pale yellow.   Eat soups and other clear broths and maintain good nutrition.   Rest as needed.   Return to work when your temperature has returned to normal or as your health care provider advises. You may need to stay home longer to avoid infecting others. You can also use a face mask and careful hand washing to prevent spread of the virus.  Increase the usage of your inhaler if you have asthma.   Do not use any tobacco products, including cigarettes, chewing tobacco, or electronic cigarettes. If you need help quitting, ask your health care provider. PREVENTION  The best way to protect yourself from getting a cold is to practice good hygiene.   Avoid oral or hand contact with people with cold   symptoms.   Wash your hands often if contact occurs.  There is no clear evidence that vitamin C, vitamin E, echinacea, or exercise reduces the chance of developing a cold. However, it is always recommended to get plenty of rest, exercise, and practice good nutrition.  SEEK MEDICAL CARE IF:   You are getting worse rather than better.   Your symptoms are not controlled by medicine.   You have chills.  You have worsening shortness of breath.  You have brown or red mucus.  You have yellow or brown nasal  discharge.  You have pain in your face, especially when you bend forward.  You have a fever.  You have swollen neck glands.  You have pain while swallowing.  You have white areas in the back of your throat. SEEK IMMEDIATE MEDICAL CARE IF:   You have severe or persistent:  Headache.  Ear pain.  Sinus pain.  Chest pain.  You have chronic lung disease and any of the following:  Wheezing.  Prolonged cough.  Coughing up blood.  A change in your usual mucus.  You have a stiff neck.  You have changes in your:  Vision.  Hearing.  Thinking.  Mood. MAKE SURE YOU:   Understand these instructions.  Will watch your condition.  Will get help right away if you are not doing well or get worse.   This information is not intended to replace advice given to you by your health care provider. Make sure you discuss any questions you have with your health care provider.   Document Released: 07/23/2000 Document Revised: 06/13/2014 Document Reviewed: 05/04/2013 [employer] Interactive Patient Education 2016 [employer]  

## 2015-11-22 NOTE — Progress Notes (Signed)
[facility]  Chief Complaint  Patient presents with  . URI  . Sinusitis    Subjective:    Patient ID: Katherine White, female    DOB: 28-Oct-1955, 60 y.o.   MRN: [id number]  Upper Respiratory Infection: Katherine White is a  60 y.o. female with a past medical history significant for allergic rhinitis, hypertension complaining of symptoms of a URI, possible sinusitis. Symptoms include congestion and sore throat. Onset of symptoms was 3 days ago, gradually improving since that time. She also c/o achiness, congestion, lightheadedness, nasal congestion and post nasal drip for the past 3 days .  She is drinking plenty of fluids. Evaluation to date: none. Treatment to date: OTC symptoms relief. The treatment has provided moderate. Patient takes care of 59 y/o grandson who is sick with similar symptoms.   Review of Systems  Constitutional: Positive for fatigue. Negative for activity change, appetite change, chills, diaphoresis, fever and unexpected weight change.  HENT: Positive for congestion, postnasal drip, rhinorrhea, sinus pressure, sore throat and tinnitus. Negative for ear discharge, ear pain, nosebleeds, sneezing, trouble swallowing and voice change.   Eyes: Positive for discharge. Negative for photophobia, pain, redness, itching and visual disturbance.  Respiratory: Positive for shortness of breath. Negative for apnea, cough, choking, chest tightness, wheezing and stridor.   Gastrointestinal: Negative.   Musculoskeletal: Positive for myalgias.  Allergic/Immunologic: Positive for environmental allergies.  Neurological: Positive for light-headedness and headaches. Negative for dizziness.       Objective:   BP 106/60 (BP Location: Left Arm, Patient Position: Sitting, Cuff Size: Normal)   Pulse 88   Temp 98.7 F (37.1 C) (Oral)   Resp 16   Wt 159 lb (72.1 kg)   BMI 31.05 kg/m   Patient Active Problem List   Diagnosis Date Noted   . Personal history of other diseases of the circulatory system 11/17/2014  . Atypical chest pain 11/17/2014  . Angina pectoris ([place]) 11/17/2014  . Takotsubo cardiomyopathy 11/17/2014  . Trouble in sleeping 11/17/2014  . Abortion, spontaneous 08/09/2014  . Arthritis 08/09/2014  . Arteriosclerosis of coronary artery 08/09/2014  . Alopecia 08/09/2014  . BP (high blood pressure) 08/09/2014  . Adiposity 08/09/2014  . Nonspecific abnormal finding in stool contents 08/09/2014  . Hypercholesterolemia without hypertriglyceridemia 08/09/2014  . Blood in stool 08/09/2014  . Avitaminosis D 08/09/2014  . Myocardial infarction 01/16/2010    Outpatient Encounter Prescriptions as of 11/22/2015  Medication Sig Note  . aspirin 81 MG tablet Take 1 tablet by mouth daily. 08/09/2014: Received from: [facility]: Take by mouth.  . carvedilol (COREG) 3.125 MG tablet Take 1 tablet by mouth daily. 08/09/2014: Received from: [facility]: Take by mouth.  . Cholecalciferol (VITAMIN D) 2000 UNITS CAPS Take 1 capsule by mouth daily. 08/09/2014: Received from: [facility]: Take by mouth.  . Ivermectin (SOOLANTRA) 1 % CREA Apply topically once daily. 11/17/2014: Received from: [facility]  . loratadine (CLARITIN REDITABS) 10 MG dissolvable tablet Take 1 tablet by mouth daily. 08/09/2014: Received from: [facility]: Take by mouth.  . losartan (COZAAR) 25 MG tablet Take 1 tablet by mouth daily. 08/09/2014: Received from: [facility]: Take by mouth.  . Magnesium Citrate 100 MG TABS Take 1 tablet by mouth daily. Reported on 06/05/2015 08/09/2014: Received from: [facility]: Take by mouth.  . MULTIPLE VITAMIN PO Take 1 tablet by  mouth daily. 08/09/2014: Received from: [facility]: Take by mouth.  .  nitroGLYCERIN (NITROSTAT) 0.4 MG SL tablet Place 1 tablet under the tongue as needed. Reported on 06/05/2015 08/09/2014: Received from: [facility]: Place under the tongue.  . simvastatin (ZOCOR) 40 MG tablet TAKE ONE-HALF TABLET DAILY   . tetrahydrozoline-zinc (EYE DROPS RELIEF) 0.05-0.25 % ophthalmic solution Apply 1 drop to eye daily. 08/09/2014: Received from: [facility]: Apply to eye.  . Azelaic Acid 15 % cream 1 application daily. 08/09/2014: Prescribed Dr. Evorn Gong Received from: [facility]:   . fluticasone (FLONASE) 50 MCG/ACT nasal spray Place 2 sprays into both nostrils daily.   . metroNIDAZOLE (METROGEL) 1 % gel 1 application daily. Reported on 06/05/2015 08/09/2014: Received from: [facility]: METROGEL, 1% (External Gel)  Gel Apply once a day for 0 days  Quantity: 1;  Refills: 0   Ordered :21-Nov-2010  Maurine Minister FNP;  Started 21-Nov-2010 Active  . pyridoxine (B6 NATURAL) 100 MG tablet Take 1 tablet by mouth daily. 08/09/2014: Received from: [facility]: Take by mouth.   No facility-administered encounter medications on file as of 11/22/2015.     Allergies  Allergen Reactions  . Aspirin     GI upset (High dosage) Can take 81 mg  . Codeine        Physical Exam  Constitutional: She is oriented to person, place, and time. She appears well-developed and well-nourished. No distress.  HENT:  Right Ear: Tympanic membrane and external ear normal.  Left Ear: Tympanic membrane and external ear normal.  Nose: Rhinorrhea present. No mucosal edema. Right sinus exhibits no maxillary sinus tenderness and no frontal sinus tenderness. Left sinus exhibits no maxillary sinus tenderness and no frontal sinus tenderness.  Mouth/Throat: Oropharynx is clear and moist. No oropharyngeal exudate.  Neck: Normal range of motion. Neck supple.   Cardiovascular: Normal rate and normal heart sounds.   Pulmonary/Chest: Effort normal and breath sounds normal. No respiratory distress. She has no wheezes. She has no rales.  Lymphadenopathy:    She has no cervical adenopathy.  Neurological: She is alert and oriented to person, place, and time.  Skin: Skin is warm and dry. She is not diaphoretic.       Assessment & Plan:   Problem List Items Addressed This Visit    None    Visit Diagnoses    Upper respiratory tract infection, unspecified type    -  Primary   Relevant Medications   fluticasone (FLONASE) 50 MCG/ACT nasal spray   Acute sinusitis, recurrence not specified, unspecified location       Relevant Medications   fluticasone (FLONASE) 50 MCG/ACT nasal spray      Antibiotics are not appropriate at this time. Advised patient on normal course of URI, sinusitis would be considered viral after 3 days of symptoms. Patient advised to call back if severe worsening or no improvement after 7 days, and we will call abx. Take coricidin 2/2 cardiac risk. Rx nasal spray for rhinorrhea, this has worked for patient in past.   Recommend rest, fluids, frequent hand washing. Work note provided  Return if symptoms worsen or fail to improve.  The entirety of the information documented in the History of Present Illness, Review of Systems and Physical Exam were personally obtained by me. Portions of this information were initially documented by Ashley Royalty, CMA and reviewed by me for thoroughness and accuracy.  Patient Instructions  Upper Respiratory Infection, Adult Most upper respiratory infections (URIs) are a viral infection of the air passages leading to the lungs. A URI affects the nose, throat, and upper air passages. The most common type of URI is nasopharyngitis and is typically referred to as "the common cold." URIs run their course and usually go away on their own. Most of the time, a URI does not require medical attention, but  sometimes a bacterial infection in the upper airways can follow a viral infection. This is called a secondary infection. Sinus and middle ear infections are common types of secondary upper respiratory infections. Bacterial pneumonia can also complicate a URI. A URI can worsen asthma and chronic obstructive pulmonary disease (COPD). Sometimes, these complications can require emergency medical care and may be life threatening.  CAUSES Almost all URIs are caused by viruses. A virus is a type of germ and can spread from one person to another.  RISKS FACTORS You may be at risk for a URI if:   You smoke.   You have chronic heart or lung disease.  You have a weakened defense (immune) system.   You are very young or very old.   You have nasal allergies or asthma.  You work in crowded or poorly ventilated areas.  You work in health care facilities or schools. SIGNS AND SYMPTOMS  Symptoms typically develop 2-3 days after you come in contact with a cold virus. Most viral URIs last 7-10 days. However, viral URIs from the influenza virus (flu virus) can last 14-18 days and are typically more severe. Symptoms may include:   Runny or stuffy (congested) nose.   Sneezing.   Cough.   Sore throat.   Headache.   Fatigue.   Fever.   Loss of appetite.   Pain in your forehead, behind your eyes, and over your cheekbones (sinus pain).  Muscle aches.  DIAGNOSIS  Your health care provider may diagnose a URI by:  Physical exam.  Tests to check that your symptoms are not due to another condition such as:  Strep throat.  Sinusitis.  Pneumonia.  Asthma. TREATMENT  A URI goes away on its own with time. It cannot be cured with medicines, but medicines may be prescribed or recommended to relieve symptoms. Medicines may help:  Reduce your fever.  Reduce your cough.  Relieve nasal congestion. HOME CARE INSTRUCTIONS   Take medicines only as directed by your health care  provider.   Gargle warm saltwater or take cough drops to comfort your throat as directed by your health care provider.  Use a warm mist humidifier or inhale steam from a shower to increase air moisture. This may make it easier to breathe.  Drink enough fluid to keep your urine clear or pale yellow.   Eat soups and other clear broths and maintain good nutrition.   Rest as needed.   Return to work when your temperature has returned to normal or as your health care provider advises. You may need to stay home longer to avoid infecting others. You can also use a face mask and careful hand washing to prevent spread of the virus.  Increase the usage of your inhaler if you have asthma.   Do not use any tobacco products, including cigarettes, chewing tobacco, or electronic cigarettes. If you need help quitting, ask your health care provider. PREVENTION  The best way to protect yourself from getting a cold is to practice good hygiene.   Avoid oral or hand contact  with people with cold symptoms.   Wash your hands often if contact occurs.  There is no clear evidence that vitamin C, vitamin E, echinacea, or exercise reduces the chance of developing a cold. However, it is always recommended to get plenty of rest, exercise, and practice good nutrition.  SEEK MEDICAL CARE IF:   You are getting worse rather than better.   Your symptoms are not controlled by medicine.   You have chills.  You have worsening shortness of breath.  You have brown or red mucus.  You have yellow or brown nasal discharge.  You have pain in your face, especially when you bend forward.  You have a fever.  You have swollen neck glands.  You have pain while swallowing.  You have white areas in the back of your throat. SEEK IMMEDIATE MEDICAL CARE IF:   You have severe or persistent:  Headache.  Ear pain.  Sinus pain.  Chest pain.  You have chronic lung disease and any of the  following:  Wheezing.  Prolonged cough.  Coughing up blood.  A change in your usual mucus.  You have a stiff neck.  You have changes in your:  Vision.  Hearing.  Thinking.  Mood. MAKE SURE YOU:   Understand these instructions.  Will watch your condition.  Will get help right away if you are not doing well or get worse.   This information is not intended to replace advice given to you by your health care provider. Make sure you discuss any questions you have with your health care provider.   Document Released: 07/23/2000 Document Revised: 06/13/2014 Document Reviewed: 05/04/2013 Elsevier Interactive Patient Education [employer].     The entirety of the information documented in the History of Present Illness, Review of Systems and Physical Exam were personally obtained by me. Portions of this information were initially documented by Ashley Royalty, CMA and reviewed by me for thoroughness and accuracy.

## 2016-02-14 ENCOUNTER — Other Ambulatory Visit: Payer: Self-pay | Admitting: Physician Assistant

## 2016-02-14 DIAGNOSIS — E7800 Pure hypercholesterolemia, unspecified: Secondary | ICD-10-CM

## 2016-03-05 ENCOUNTER — Telehealth: Payer: Self-pay

## 2016-03-05 DIAGNOSIS — J40 Bronchitis, not specified as acute or chronic: Secondary | ICD-10-CM

## 2016-03-05 MED ORDER — AZITHROMYCIN 250 MG PO TABS: ORAL_TABLET | ORAL | 0 refills | Status: DC

## 2016-03-05 MED ORDER — ALBUTEROL SULFATE HFA 108 (90 BASE) MCG/ACT IN AERS
2.0000 | INHALATION_SPRAY | Freq: Four times a day (QID) | RESPIRATORY_TRACT | 0 refills | Status: DC | PRN
Start: 2016-03-05 — End: 2016-06-05

## 2016-03-05 NOTE — Telephone Encounter (Signed)
Sent in zpak and albuterol inhaler to [facility].

## 2016-03-05 NOTE — Telephone Encounter (Signed)
Advised patient as below.  

## 2016-03-05 NOTE — Telephone Encounter (Signed)
Patient called saying that she has had bronchitis for about 1 week. She reports that she was seen at [facility] (urgent care) on Sat (03/01/16) and they prescribed a Zpak and promethazine cough syrup. Patient reports that she takes her last dose today. Patient reports that she still has a low grade fever and a productive cough with associated wheezing. Patient reports that they did not prescribe an inhaler, but she mentions that her breathing has gotten worse. She has taken Mucinex OTC with mild relief.   Patient wanted to be seen in the office tomorrow, but I advised her that you will be out of the office until next week. Patient does not want to see anyone else. She was requesting another abx (Zpak preferably, or one that you feel will help better) into her pharmacy. Patient uses [facility]. Contact info is correct. Thanks!

## 2016-03-15 ENCOUNTER — Ambulatory Visit (INDEPENDENT_AMBULATORY_CARE_PROVIDER_SITE_OTHER): Payer: 59 | Admitting: Family Medicine

## 2016-03-15 DIAGNOSIS — Z23 Encounter for immunization: Secondary | ICD-10-CM

## 2016-06-05 ENCOUNTER — Ambulatory Visit (INDEPENDENT_AMBULATORY_CARE_PROVIDER_SITE_OTHER): Payer: 59 | Admitting: Physician Assistant

## 2016-06-05 ENCOUNTER — Encounter: Payer: Self-pay | Admitting: Physician Assistant

## 2016-06-05 VITALS — BP 114/64 | HR 76 | Temp 97.9°F | Resp 16 | Ht 60.5 in | Wt 161.0 lb

## 2016-06-05 DIAGNOSIS — Z0000 Encounter for general adult medical examination without abnormal findings: Secondary | ICD-10-CM | POA: Diagnosis not present

## 2016-06-05 DIAGNOSIS — I10 Essential (primary) hypertension: Secondary | ICD-10-CM | POA: Diagnosis not present

## 2016-06-05 DIAGNOSIS — Z8052 Family history of malignant neoplasm of bladder: Secondary | ICD-10-CM

## 2016-06-05 DIAGNOSIS — Z126 Encounter for screening for malignant neoplasm of bladder: Secondary | ICD-10-CM | POA: Diagnosis not present

## 2016-06-05 DIAGNOSIS — F458 Other somatoform disorders: Secondary | ICD-10-CM

## 2016-06-05 DIAGNOSIS — Z1231 Encounter for screening mammogram for malignant neoplasm of breast: Secondary | ICD-10-CM

## 2016-06-05 DIAGNOSIS — Z1239 Encounter for other screening for malignant neoplasm of breast: Secondary | ICD-10-CM

## 2016-06-05 DIAGNOSIS — Z1159 Encounter for screening for other viral diseases: Secondary | ICD-10-CM

## 2016-06-05 DIAGNOSIS — Z803 Family history of malignant neoplasm of breast: Secondary | ICD-10-CM | POA: Diagnosis not present

## 2016-06-05 DIAGNOSIS — E7800 Pure hypercholesterolemia, unspecified: Secondary | ICD-10-CM

## 2016-06-05 DIAGNOSIS — R400 Somnolence: Secondary | ICD-10-CM

## 2016-06-05 DIAGNOSIS — Z114 Encounter for screening for human immunodeficiency virus [HIV]: Secondary | ICD-10-CM

## 2016-06-05 DIAGNOSIS — Z1211 Encounter for screening for malignant neoplasm of colon: Secondary | ICD-10-CM | POA: Diagnosis not present

## 2016-06-05 DIAGNOSIS — R0683 Snoring: Secondary | ICD-10-CM

## 2016-06-05 DIAGNOSIS — Z6830 Body mass index (BMI) 30.0-30.9, adult: Secondary | ICD-10-CM

## 2016-06-05 LAB — POCT URINALYSIS DIPSTICK
BILIRUBIN UA: NEGATIVE
Glucose, UA: NEGATIVE
KETONES UA: NEGATIVE
Leukocytes, UA: NEGATIVE
Nitrite, UA: NEGATIVE
PH UA: 6 (ref 5.0–8.0)
PROTEIN UA: NEGATIVE
RBC UA: NEGATIVE
Urobilinogen, UA: 0.2 E.U./dL

## 2016-06-05 NOTE — Patient Instructions (Signed)
Health Maintenance for Postmenopausal Women Menopause is a normal process in which your reproductive ability comes to an end. This process happens gradually over a span of months to years, usually between the ages of 33 and 38. Menopause is complete when you have missed 12 consecutive menstrual periods. It is important to talk with your health care provider about some of the most common conditions that affect postmenopausal women, such as heart disease, cancer, and bone loss (osteoporosis). Adopting a healthy lifestyle and getting preventive care can help to promote your health and wellness. Those actions can also lower your chances of developing some of these common conditions. What should I know about menopause? During menopause, you may experience a number of symptoms, such as:  Moderate-to-severe hot flashes.  Night sweats.  Decrease in sex drive.  Mood swings.  Headaches.  Tiredness.  Irritability.  Memory problems.  Insomnia. Choosing to treat or not to treat menopausal changes is an individual decision that you make with your health care provider. What should I know about hormone replacement therapy and supplements? Hormone therapy products are effective for treating symptoms that are associated with menopause, such as hot flashes and night sweats. Hormone replacement carries certain risks, especially as you become older. If you are thinking about using estrogen or estrogen with progestin treatments, discuss the benefits and risks with your health care provider. What should I know about heart disease and stroke? Heart disease, heart attack, and stroke become more likely as you age. This may be due, in part, to the hormonal changes that your body experiences during menopause. These can affect how your body processes dietary fats, triglycerides, and cholesterol. Heart attack and stroke are both medical emergencies. There are many things that you can do to help prevent heart disease  and stroke:  Have your blood pressure checked at least every 1-2 years. High blood pressure causes heart disease and increases the risk of stroke.  If you are 48-61 years old, ask your health care provider if you should take aspirin to prevent a heart attack or a stroke.  Do not use any tobacco products, including cigarettes, chewing tobacco, or electronic cigarettes. If you need help quitting, ask your health care provider.  It is important to eat a healthy diet and maintain a healthy weight.  Be sure to include plenty of vegetables, fruits, low-fat dairy products, and lean protein.  Avoid eating foods that are high in solid fats, added sugars, or salt (sodium).  Get regular exercise. This is one of the most important things that you can do for your health.  Try to exercise for at least 150 minutes each week. The type of exercise that you do should increase your heart rate and make you sweat. This is known as moderate-intensity exercise.  Try to do strengthening exercises at least twice each week. Do these in addition to the moderate-intensity exercise.  Know your numbers.Ask your health care provider to check your cholesterol and your blood glucose. Continue to have your blood tested as directed by your health care provider. What should I know about cancer screening? There are several types of cancer. Take the following steps to reduce your risk and to catch any cancer development as early as possible. Breast Cancer  Practice breast self-awareness.  This means understanding how your breasts normally appear and feel.  It also means doing regular breast self-exams. Let your health care provider know about any changes, no matter how small.  If you are 40 or older,  have a clinician do a breast exam (clinical breast exam or CBE) every year. Depending on your age, family history, and medical history, it may be recommended that you also have a yearly breast X-ray (mammogram).  If you  have a family history of breast cancer, talk with your health care provider about genetic screening.  If you are at high risk for breast cancer, talk with your health care provider about having an MRI and a mammogram every year.  Breast cancer (BRCA) gene test is recommended for women who have family members with BRCA-related cancers. Results of the assessment will determine the need for genetic counseling and BRCA1 and for BRCA2 testing. BRCA-related cancers include these types:  Breast. This occurs in males or females.  Ovarian.  Tubal. This may also be called fallopian tube cancer.  Cancer of the abdominal or pelvic lining (peritoneal cancer).  Prostate.  Pancreatic. Cervical, Uterine, and Ovarian Cancer  Your health care provider may recommend that you be screened regularly for cancer of the pelvic organs. These include your ovaries, uterus, and vagina. This screening involves a pelvic exam, which includes checking for microscopic changes to the surface of your cervix (Pap test).  For women ages 21-65, health care providers may recommend a pelvic exam and a Pap test every three years. For women ages 23-65, they may recommend the Pap test and pelvic exam, combined with testing for human papilloma virus (HPV), every five years. Some types of HPV increase your risk of cervical cancer. Testing for HPV may also be done on women of any age who have unclear Pap test results.  Other health care providers may not recommend any screening for nonpregnant women who are considered low risk for pelvic cancer and have no symptoms. Ask your health care provider if a screening pelvic exam is right for you.  If you have had past treatment for cervical cancer or a condition that could lead to cancer, you need Pap tests and screening for cancer for at least 20 years after your treatment. If Pap tests have been discontinued for you, your risk factors (such as having a new sexual partner) need to be reassessed  to determine if you should start having screenings again. Some women have medical problems that increase the chance of getting cervical cancer. In these cases, your health care provider may recommend that you have screening and Pap tests more often.  If you have a family history of uterine cancer or ovarian cancer, talk with your health care provider about genetic screening.  If you have vaginal bleeding after reaching menopause, tell your health care provider.  There are currently no reliable tests available to screen for ovarian cancer. Lung Cancer  Lung cancer screening is recommended for adults 99-83 years old who are at high risk for lung cancer because of a history of smoking. A yearly low-dose CT scan of the lungs is recommended if you:  Currently smoke.  Have a history of at least 30 pack-years of smoking and you currently smoke or have quit within the past 15 years. A pack-year is smoking an average of one pack of cigarettes per day for one year. Yearly screening should:  Continue until it has been 15 years since you quit.  Stop if you develop a health problem that would prevent you from having lung cancer treatment. Colorectal Cancer  This type of cancer can be detected and can often be prevented.  Routine colorectal cancer screening usually begins at age 72 and continues  through age 75.  If you have risk factors for colon cancer, your health care provider may recommend that you be screened at an earlier age.  If you have a family history of colorectal cancer, talk with your health care provider about genetic screening.  Your health care provider may also recommend using home test kits to check for hidden blood in your stool.  A small camera at the end of a tube can be used to examine your colon directly (sigmoidoscopy or colonoscopy). This is done to check for the earliest forms of colorectal cancer.  Direct examination of the colon should be repeated every 5-10 years until  age 75. However, if early forms of precancerous polyps or small growths are found or if you have a family history or genetic risk for colorectal cancer, you may need to be screened more often. Skin Cancer  Check your skin from head to toe regularly.  Monitor any moles. Be sure to tell your health care provider:  About any new moles or changes in moles, especially if there is a change in a mole's shape or color.  If you have a mole that is larger than the size of a pencil eraser.  If any of your family members has a history of skin cancer, especially at a young age, talk with your health care provider about genetic screening.  Always use sunscreen. Apply sunscreen liberally and repeatedly throughout the day.  Whenever you are outside, protect yourself by wearing long sleeves, pants, a wide-brimmed hat, and sunglasses. What should I know about osteoporosis? Osteoporosis is a condition in which bone destruction happens more quickly than new bone creation. After menopause, you may be at an increased risk for osteoporosis. To help prevent osteoporosis or the bone fractures that can happen because of osteoporosis, the following is recommended:  If you are 19-50 years old, get at least 1,000 mg of calcium and at least 600 mg of vitamin D per day.  If you are older than age 50 but younger than age 70, get at least 1,200 mg of calcium and at least 600 mg of vitamin D per day.  If you are older than age 70, get at least 1,200 mg of calcium and at least 800 mg of vitamin D per day. Smoking and excessive alcohol intake increase the risk of osteoporosis. Eat foods that are rich in calcium and vitamin D, and do weight-bearing exercises several times each week as directed by your health care provider. What should I know about how menopause affects my mental health? Depression may occur at any age, but it is more common as you become older. Common symptoms of depression include:  Low or sad  mood.  Changes in sleep patterns.  Changes in appetite or eating patterns.  Feeling an overall lack of motivation or enjoyment of activities that you previously enjoyed.  Frequent crying spells. Talk with your health care provider if you think that you are experiencing depression. What should I know about immunizations? It is important that you get and maintain your immunizations. These include:  Tetanus, diphtheria, and pertussis (Tdap) booster vaccine.  Influenza every year before the flu season begins.  Pneumonia vaccine.  Shingles vaccine. Your health care provider may also recommend other immunizations. This information is not intended to replace advice given to you by your health care provider. Make sure you discuss any questions you have with your health care provider. Document Released: 03/21/2005 Document Revised: 08/17/2015 Document Reviewed: 10/31/2014 Elsevier Interactive Patient   Education  2017 Elsevier Inc[employer]  

## 2016-06-05 NOTE — Progress Notes (Signed)
Patient: Katherine White, Female    DOB: 22-Jan-1956, 61 y.o.   MRN: [id number] Visit Date: 06/05/2016  Today's Provider: Mar Daring, PA-C   Chief Complaint  Patient presents with  . Annual Exam   Subjective:    Annual physical exam Bridgid Printz is a 61 y.o. female who presents today for health maintenance and complete physical. She feels fairly well. Pt is experiencing allergy sx. She reports exercising some. Pt has decreased exercise since her father passed away. She does keep her 2 YO grandson, which keeps her active. She reports she is sleeping fairly well. Pt's husband is c/o pt snoring. He would like Mrs. Berg to be tested for sleep apnea.  Last CPE- 06/05/2015 Last pap- 05/18/2014- WNL. Repeat 5 years.  Last mammogram- 08/01/2015- BI-RADS 1 Last colonoscopy- 05/07/2006- per note on [place] -----------------------------------------------------------------   Review of Systems  Constitutional: Negative.   HENT: Positive for sneezing, sore throat and tinnitus. Negative for congestion, dental problem, drooling, ear discharge, ear pain, facial swelling, hearing loss, mouth sores, nosebleeds, postnasal drip, rhinorrhea, sinus pain, sinus pressure, trouble swallowing and voice change.   Eyes: Positive for itching. Negative for photophobia, pain, discharge, redness and visual disturbance.  Respiratory: Negative.   Cardiovascular: Negative.   Gastrointestinal: Negative.   Endocrine: Negative.   Genitourinary: Negative.   Musculoskeletal: Negative.   Skin: Negative.   Allergic/Immunologic: Negative.   Neurological: Negative.   Hematological: Negative.   Psychiatric/Behavioral: Negative.     Social History      She  reports that she has quit smoking. She quit after 1.00 year of use. She has never used smokeless tobacco. She reports that she drinks alcohol. She reports that she does not use drugs.       Social History   Social History   . Marital status: Married    Spouse name: Belenda Cruise  . Number of children: 4  . Years of education: some college   Occupational History  .  Retired   Social History Main Topics  . Smoking status: Former Smoker    Years: 1.00  . Smokeless tobacco: Never Used  . Alcohol use 0.0 oz/week     Comment: OCCASIONALLY  . Drug use: No  . Sexual activity: Yes   Other Topics Concern  . None   Social History Narrative  . None    History reviewed. No pertinent past medical history.   Patient Active Problem List   Diagnosis Date Noted  . Personal history of other diseases of the circulatory system 11/17/2014  . Atypical chest pain 11/17/2014  . Angina pectoris ([place]) 11/17/2014  . Takotsubo cardiomyopathy 11/17/2014  . Trouble in sleeping 11/17/2014  . Abortion, spontaneous 08/09/2014  . Arthritis 08/09/2014  . Arteriosclerosis of coronary artery 08/09/2014  . Alopecia 08/09/2014  . BP (high blood pressure) 08/09/2014  . Adiposity 08/09/2014  . Nonspecific abnormal finding in stool contents 08/09/2014  . Hypercholesterolemia without hypertriglyceridemia 08/09/2014  . Blood in stool 08/09/2014  . Avitaminosis D 08/09/2014  . Myocardial infarction ([place]) 01/16/2010    Past Surgical History:  Procedure Laterality Date  . CARDIAC CATHETERIZATION  2011  . DILATION AND CURETTAGE OF UTERUS  1980, 1981   AFTER 2 MISCARRIAGES  . LEEP  1999  . TONSILLECTOMY AND ADENOIDECTOMY  1964    Family History        Family Status  Relation Status  . Brother Alive  . Maternal Aunt Alive  .  Paternal Uncle Deceased  . Maternal Grandmother Deceased  . Paternal Grandmother Deceased  . Mother Deceased  . Father Deceased  . Maternal Grandfather Deceased  . Brother Alive  . Other         Her family history includes Bladder Cancer in her father; Breast cancer in her maternal aunt and other; Breast cancer (age of onset: 86) in her mother; Coronary artery disease in her mother; Diabetes in her  maternal grandmother, other, and paternal uncle; Heart attack in her maternal grandfather; Heart disease in her father, mother, and other; Hyperlipidemia in her other; Hypertension in her brother; Liver cancer in her mother; Pneumonia in her paternal grandmother.     Allergies  Allergen Reactions  . Aspirin     GI upset (High dosage) Can take 81 mg  . Codeine      Current Outpatient Prescriptions:  .  aspirin 81 MG tablet, Take 1 tablet by mouth daily., Disp: , Rfl:  .  carvedilol (COREG) 3.125 MG tablet, Take 1 tablet by mouth daily., Disp: , Rfl:  .  Cholecalciferol (VITAMIN D) 2000 UNITS CAPS, Take 1 capsule by mouth daily., Disp: , Rfl:  .  fluticasone (FLONASE) 50 MCG/ACT nasal spray, Place 2 sprays into both nostrils daily., Disp: 1 g, Rfl: 0 .  Ivermectin (SOOLANTRA) 1 % CREA, Apply topically once daily., Disp: , Rfl:  .  loratadine (CLARITIN REDITABS) 10 MG dissolvable tablet, Take 1 tablet by mouth daily., Disp: , Rfl:  .  losartan (COZAAR) 25 MG tablet, Take 1 tablet by mouth daily., Disp: , Rfl:  .  Magnesium Citrate 100 MG TABS, Take 1 tablet by mouth daily. Reported on 06/05/2015, Disp: , Rfl:  .  MULTIPLE VITAMIN PO, Take 1 tablet by mouth daily., Disp: , Rfl:  .  nitroGLYCERIN (NITROSTAT) 0.4 MG SL tablet, Place 1 tablet under the tongue as needed. Reported on 06/05/2015, Disp: , Rfl:  .  pyridoxine (B6 NATURAL) 100 MG tablet, Take 1 tablet by mouth daily., Disp: , Rfl:  .  simvastatin (ZOCOR) 40 MG tablet, TAKE ONE-HALF TABLET DAILY, Disp: 30 tablet, Rfl: 5 .  tetrahydrozoline-zinc (EYE DROPS RELIEF) 0.05-0.25 % ophthalmic solution, Apply 1 drop to eye daily., Disp: , Rfl:    Patient Care Team: Mar Daring, PA-C as PCP - General (Physician Assistant)      Objective:   Vitals: BP 114/64 (BP Location: Left Arm, Patient Position: Sitting, Cuff Size: Normal)   Pulse 76   Temp 97.9 F (36.6 C) (Oral)   Resp 16   Ht 5' 0.5" (1.537 m)   Wt 161 lb (73 kg)   BMI  30.93 kg/m    Vitals:   06/05/16 0911  BP: 114/64  Pulse: 76  Resp: 16  Temp: 97.9 F (36.6 C)  TempSrc: Oral  Weight: 161 lb (73 kg)  Height: 5' 0.5" (1.537 m)     Physical Exam  Constitutional: She is oriented to person, place, and time. She appears well-developed and well-nourished. No distress.  HENT:  Head: Normocephalic and atraumatic.  Right Ear: Hearing, tympanic membrane, external ear and ear canal normal.  Left Ear: Hearing, tympanic membrane, external ear and ear canal normal.  Nose: Nose normal.  Mouth/Throat: Uvula is midline, oropharynx is clear and moist and mucous membranes are normal. No oropharyngeal exudate.  Eyes: Conjunctivae and EOM are normal. Pupils are equal, round, and reactive to light. Right eye exhibits no discharge. Left eye exhibits no discharge. No scleral icterus.  Neck:  Normal range of motion. Neck supple. No JVD present. Carotid bruit is not present. No tracheal deviation present. No thyromegaly present.  Cardiovascular: Normal rate, regular rhythm, normal heart sounds and intact distal pulses.  Exam reveals no gallop and no friction rub.   No murmur heard. Pulmonary/Chest: Effort normal and breath sounds normal. No respiratory distress. She has no wheezes. She has no rales. She exhibits no tenderness.  Abdominal: Soft. Bowel sounds are normal. She exhibits no distension and no mass. There is no tenderness. There is no rebound and no guarding.  Musculoskeletal: Normal range of motion. She exhibits no edema or tenderness.  Lymphadenopathy:    She has no cervical adenopathy.  Neurological: She is alert and oriented to person, place, and time.  Skin: Skin is warm and dry. No rash noted. She is not diaphoretic.  Psychiatric: She has a normal mood and affect. Her behavior is normal. Judgment and thought content normal.  Vitals reviewed.    Depression Screen PHQ 2/9 Scores 06/05/2016  PHQ - 2 Score 0  PHQ- 9 Score 2      Assessment & Plan:      Routine Health Maintenance and Physical Exam  Exercise Activities and Dietary recommendations Goals    None      Immunization History  Administered Date(s) Administered  . Influenza,inj,Quad PF,36+ Mos 11/17/2014, 03/15/2016  . Tdap 03/04/2006, 12/29/2013  . Zoster 04/10/2011    Health Maintenance  Topic Date Due  . Hepatitis C Screening  1955/04/27  . HIV Screening  04/18/1971  . COLONOSCOPY  04/18/2006  . PAP SMEAR  05/18/2015  . MAMMOGRAM  07/31/2016  . INFLUENZA VACCINE  09/10/2016  . TETANUS/TDAP  12/30/2023     Discussed health benefits of physical activity, and encouraged her to engage in regular exercise appropriate for her age and condition.    1. Annual physical exam Normal physical exam today. Will check labs as below and f/u pending lab results. If labs are stable and WNL she will not need to have these rechecked for one year at her next annual physical exam. She is to call the office in the meantime if she has any acute issue, questions or concerns. - CBC w/Diff/Platelet - Comprehensive Metabolic Panel (CMET) - TSH - Lipid Profile  2. Breast cancer screening Breast exam today was normal. There is family history of breast cancer in her mother (age 46) and maternal aunt. She does perform regular self breast exams. Mammogram was ordered as below. Information for [facility] clinic was given to patient so she may schedule her mammogram at her convenience. - MM Digital Screening; Future  3. Family history of breast cancer in mother Age of 45 onset. - MM Digital Screening; Future  4. Colon cancer screening Due for repeat colonoscopy. Last was in 2008 with Dr. Allen Norris. Referral placed as below.  - Ambulatory referral to Gastroenterology  5. Bladder cancer screening UA normal.  - POCT Urinalysis Dipstick  6. Family history of bladder cancer History in father. UA normal.  - POCT Urinalysis Dipstick  7. Essential hypertension Stable. Continue  carvedilol 3.125mg , losartan 25mg . Will check labs as below and f/u pending results. - CBC w/Diff/Platelet - Comprehensive Metabolic Panel (CMET) - TSH  8. Hypercholesterolemia without hypertriglyceridemia Stable on simvastatin 40mg . Will check labs as below and f/u pending results. - CBC w/Diff/Platelet - Comprehensive Metabolic Panel (CMET) - TSH  9. Screening for HIV without presence of risk factors - HIV antibody (with reflex)  10. Need for hepatitis  C screening test - Hepatitis C Antibody  11. BMI 30.0-30.9,adult Active with grandson whom she keeps daily. No formal exercise. Tries to eat healthier diet. Having increased snoring and abnormal breathing at night per husband. Dentist states she is grinding her teeth. Does have daytime fatigue. Epworth score was 14. Will refer to sleep studies as below. Patient reports her dentist is wanting to fit her with a nightguard but she needs a sleep study confirming sleep apnea prior to being able to have mouth guard approved by insurance.  - Ambulatory referral to Sleep Studies  12. Snoring See above medical treatment plan. - Ambulatory referral to Sleep Studies  13. Daytime somnolence See above medical treatment plan. - Ambulatory referral to Sleep Studies  14. Teeth grinding See above medical treatment plan.  --------------------------------------------------------------------  Patient seen and examined by Mar Daring, PA-C, and note scribed by Renaldo Fiddler, CMA.  Mar Daring, PA-C  [facility]

## 2016-06-06 LAB — COMPREHENSIVE METABOLIC PANEL
ALT: 35 IU/L — ABNORMAL HIGH (ref 0–32)
AST: 20 IU/L (ref 0–40)
Albumin/Globulin Ratio: 2 (ref 1.2–2.2)
Albumin: 4.5 g/dL (ref 3.6–4.8)
Alkaline Phosphatase: 90 IU/L (ref 39–117)
BUN/Creatinine Ratio: 17 (ref 12–28)
BUN: 12 mg/dL (ref 8–27)
Bilirubin Total: 0.3 mg/dL (ref 0.0–1.2)
CALCIUM: 9.8 mg/dL (ref 8.7–10.3)
CO2: 22 mmol/L (ref 18–29)
CREATININE: 0.7 mg/dL (ref 0.57–1.00)
Chloride: 105 mmol/L (ref 96–106)
GFR calc Af Amer: 109 mL/min/{1.73_m2} (ref >59)
GFR, EST NON AFRICAN AMERICAN: 94 mL/min/{1.73_m2} (ref >59)
GLOBULIN, TOTAL: 2.2 g/dL (ref 1.5–4.5)
Glucose: 95 mg/dL (ref 65–99)
Potassium: 4.8 mmol/L (ref 3.5–5.2)
SODIUM: 142 mmol/L (ref 134–144)
Total Protein: 6.7 g/dL (ref 6.0–8.5)

## 2016-06-06 LAB — CBC WITH DIFFERENTIAL/PLATELET
Basophils Absolute: 0 10*3/uL (ref 0.0–0.2)
Basos: 1 %
EOS (ABSOLUTE): 0.3 10*3/uL (ref 0.0–0.4)
EOS: 5 %
HEMATOCRIT: 43.4 % (ref 34.0–46.6)
HEMOGLOBIN: 14.8 g/dL (ref 11.1–15.9)
IMMATURE GRANULOCYTES: 0 %
Immature Grans (Abs): 0 10*3/uL (ref 0.0–0.1)
LYMPHS: 32 %
Lymphocytes Absolute: 2 10*3/uL (ref 0.7–3.1)
MCH: 28.8 pg (ref 26.6–33.0)
MCHC: 34.1 g/dL (ref 31.5–35.7)
MCV: 85 fL (ref 79–97)
MONOCYTES: 7 %
Monocytes Absolute: 0.5 10*3/uL (ref 0.1–0.9)
NEUTROS PCT: 55 %
Neutrophils Absolute: 3.4 10*3/uL (ref 1.4–7.0)
Platelets: 236 10*3/uL (ref 150–379)
RBC: 5.13 x10E6/uL (ref 3.77–5.28)
RDW: 14.4 % (ref 12.3–15.4)
WBC: 6.2 10*3/uL (ref 3.4–10.8)

## 2016-06-06 LAB — LIPID PANEL
Chol/HDL Ratio: 3.8 ratio (ref 0.0–4.4)
Cholesterol, Total: 181 mg/dL (ref 100–199)
HDL: 48 mg/dL (ref >39)
LDL Calculated: 99 mg/dL (ref 0–99)
Triglycerides: 172 mg/dL — ABNORMAL HIGH (ref 0–149)
VLDL CHOLESTEROL CAL: 34 mg/dL (ref 5–40)

## 2016-06-06 LAB — TSH: TSH: 1.73 u[IU]/mL (ref 0.450–4.500)

## 2016-06-06 LAB — HEPATITIS C ANTIBODY

## 2016-06-06 LAB — HIV ANTIBODY (ROUTINE TESTING W REFLEX): HIV SCREEN 4TH GENERATION: NONREACTIVE

## 2016-06-13 ENCOUNTER — Telehealth: Payer: Self-pay | Admitting: Physician Assistant

## 2016-06-13 DIAGNOSIS — Z1211 Encounter for screening for malignant neoplasm of colon: Secondary | ICD-10-CM

## 2016-06-13 NOTE — Telephone Encounter (Signed)
Please Review.  Thanks,  -Joseline 

## 2016-06-13 NOTE — Telephone Encounter (Signed)
Referral placed to [facility]

## 2016-06-13 NOTE — Telephone Encounter (Signed)
Pt states she has been trying to reach Dr Allen Norris office all this week and has left a message to get her colonoscopy scheduled but has not received a call back.  Pt is requesting another referral to be sent to Dr Gustavo Lah with [facility].  [id number] or [phone]

## 2016-06-20 ENCOUNTER — Other Ambulatory Visit: Payer: Self-pay

## 2016-06-20 ENCOUNTER — Telehealth: Payer: Self-pay

## 2016-06-20 DIAGNOSIS — Z1211 Encounter for screening for malignant neoplasm of colon: Secondary | ICD-10-CM

## 2016-06-20 NOTE — Telephone Encounter (Signed)
Gastroenterology Pre-Procedure Review  Request Date: 6/21 Requesting Physician: Dr. Vicente Males  PATIENT REVIEW QUESTIONS: The patient responded to the following health history questions as indicated:    1. Are you having any GI issues? no 2. Do you have a personal history of Polyps? no 3. Do you have a family history of Colon Cancer or Polyps? no 4. Diabetes Mellitus? no 5. Joint replacements in the past 12 months?no 6. Major health problems in the past 3 months?no 7. Any artificial heart valves, MVP, or defibrillator?yes (cardiomyopathy)    MEDICATIONS & ALLERGIES:    Patient reports the following regarding taking any anticoagulation/antiplatelet therapy:   Plavix, Coumadin, Eliquis, Xarelto, Lovenox, Pradaxa, Brilinta, or Effient? no Aspirin? yes (81mg )  Patient confirms/reports the following medications:  Current Outpatient Prescriptions  Medication Sig Dispense Refill  . aspirin 81 MG tablet Take 1 tablet by mouth daily.    . carvedilol (COREG) 3.125 MG tablet Take 1 tablet by mouth daily.    . Cholecalciferol (VITAMIN D) 2000 UNITS CAPS Take 1 capsule by mouth daily.    . fluticasone (FLONASE) 50 MCG/ACT nasal spray Place 2 sprays into both nostrils daily. 1 g 0  . Ivermectin (SOOLANTRA) 1 % CREA Apply topically once daily.    Marland Kitchen loratadine (CLARITIN REDITABS) 10 MG dissolvable tablet Take 1 tablet by mouth daily.    Marland Kitchen losartan (COZAAR) 25 MG tablet Take 1 tablet by mouth daily.    . Magnesium Citrate 100 MG TABS Take 1 tablet by mouth daily. Reported on 06/05/2015    . MULTIPLE VITAMIN PO Take 1 tablet by mouth daily.    . nitroGLYCERIN (NITROSTAT) 0.4 MG SL tablet Place 1 tablet under the tongue as needed. Reported on 06/05/2015    . pyridoxine (B6 NATURAL) 100 MG tablet Take 1 tablet by mouth daily.    . simvastatin (ZOCOR) 40 MG tablet TAKE ONE-HALF TABLET DAILY 30 tablet 5  . tetrahydrozoline-zinc (EYE DROPS RELIEF) 0.05-0.25 % ophthalmic solution Apply 1 drop to eye daily.      No current facility-administered medications for this visit.     Patient confirms/reports the following allergies:  Allergies  Allergen Reactions  . Aspirin     GI upset (High dosage) Can take 81 mg  . Codeine     No orders of the defined types were placed in this encounter.   AUTHORIZATION INFORMATION Primary Insurance: 1D#: Group #:  Secondary Insurance: 1D#: Group #:  SCHEDULE INFORMATION: Date: 6/21 Time: Location: [facility]

## 2016-06-24 ENCOUNTER — Telehealth: Payer: Self-pay | Admitting: Gastroenterology

## 2016-06-24 ENCOUNTER — Telehealth: Payer: Self-pay | Admitting: Physician Assistant

## 2016-06-24 NOTE — Telephone Encounter (Signed)
Please Review.  Thanks,  -Joseline 

## 2016-06-24 NOTE — Telephone Encounter (Signed)
Pt states that she spoke with her insurance company and was told that in lab sleep study was denied.Pt would like to know what is her next step

## 2016-06-24 NOTE — Telephone Encounter (Signed)
I had not seen this yet, but we will put in for an in-home sleep study next.

## 2016-06-24 NOTE — Telephone Encounter (Signed)
06/24/16 Spoke with Ronalee Belts at [facility] for Screening Colonoscopy [phone] / Z12.11 Auth #: [id number]

## 2016-06-25 NOTE — Telephone Encounter (Signed)
Katherine White,  Can we do an in home study order for her please. In Lab was denied.  Thanks,  [name]

## 2016-06-27 NOTE — Telephone Encounter (Signed)
Order for [facility] sent back to [place] to sign

## 2016-06-27 NOTE — Telephone Encounter (Signed)
Order for home sleep study faxed to [facility]

## 2016-07-25 ENCOUNTER — Encounter: Payer: Self-pay | Admitting: Physician Assistant

## 2016-07-25 ENCOUNTER — Ambulatory Visit (INDEPENDENT_AMBULATORY_CARE_PROVIDER_SITE_OTHER): Payer: 59 | Admitting: Physician Assistant

## 2016-07-25 VITALS — BP 112/64 | HR 68 | Temp 97.6°F | Resp 16 | Wt 161.0 lb

## 2016-07-25 DIAGNOSIS — J069 Acute upper respiratory infection, unspecified: Secondary | ICD-10-CM

## 2016-07-25 DIAGNOSIS — J040 Acute laryngitis: Secondary | ICD-10-CM | POA: Diagnosis not present

## 2016-07-25 DIAGNOSIS — J029 Acute pharyngitis, unspecified: Secondary | ICD-10-CM | POA: Diagnosis not present

## 2016-07-25 MED ORDER — AMOXICILLIN 875 MG PO TABS: 875.0000 mg | ORAL_TABLET | Freq: Two times a day (BID) | ORAL | 0 refills | Status: DC

## 2016-07-25 MED ORDER — FLUTICASONE PROPIONATE 50 MCG/ACT NA SUSP: 2.0000 | Freq: Every day | NASAL | 0 refills | Status: DC

## 2016-07-25 NOTE — Progress Notes (Signed)
Patient: Katherine White Female    DOB: 1955/08/08   61 y.o.   MRN: [id number] Visit Date: 07/25/2016  Today's Provider: Trinna Post, PA-C   Chief Complaint  Patient presents with  . Sinusitis    Started Wednesday.    Subjective:    Sinusitis  This is a new problem. There has been no fever. Associated symptoms include congestion, coughing, ear pain (Ear pain has improved. ), headaches, a hoarse voice and sinus pressure. Pertinent negatives include no chills, diaphoresis, neck pain, shortness of breath, sneezing, sore throat or swollen glands.   Onset 3 days ago. Exposure to her sick grandchild and sick children/daughter in [employer].     Allergies  Allergen Reactions  . Aspirin     GI upset (High dosage) Can take 81 mg  . Codeine      Current Outpatient Prescriptions:  .  aspirin 81 MG tablet, Take 1 tablet by mouth daily., Disp: , Rfl:  .  carvedilol (COREG) 3.125 MG tablet, Take 1 tablet by mouth daily., Disp: , Rfl:  .  Cholecalciferol (VITAMIN D) 2000 UNITS CAPS, Take 1 capsule by mouth daily., Disp: , Rfl:  .  fluticasone (FLONASE) 50 MCG/ACT nasal spray, Place 2 sprays into both nostrils daily., Disp: 1 g, Rfl: 0 .  Ivermectin (SOOLANTRA) 1 % CREA, Apply topically once daily., Disp: , Rfl:  .  losartan (COZAAR) 25 MG tablet, Take 1 tablet by mouth daily., Disp: , Rfl:  .  Magnesium Citrate 100 MG TABS, Take 1 tablet by mouth daily. Reported on 06/05/2015, Disp: , Rfl:  .  MULTIPLE VITAMIN PO, Take 1 tablet by mouth daily., Disp: , Rfl:  .  nitroGLYCERIN (NITROSTAT) 0.4 MG SL tablet, Place 1 tablet under the tongue as needed. Reported on 06/05/2015, Disp: , Rfl:  .  pyridoxine (B6 NATURAL) 100 MG tablet, Take 1 tablet by mouth daily., Disp: , Rfl:  .  simvastatin (ZOCOR) 40 MG tablet, TAKE ONE-HALF TABLET DAILY, Disp: 30 tablet, Rfl: 5 .  tetrahydrozoline-zinc (EYE DROPS RELIEF) 0.05-0.25 % ophthalmic solution, Apply 1 drop to eye daily., Disp: , Rfl:  .   loratadine (CLARITIN REDITABS) 10 MG dissolvable tablet, Take 1 tablet by mouth daily., Disp: , Rfl:   Review of Systems  Constitutional: Positive for fatigue. Negative for activity change, appetite change, chills, diaphoresis, fever and unexpected weight change.  HENT: Positive for congestion, ear pain (Ear pain has improved. ), hoarse voice, postnasal drip, sinus pain, sinus pressure, tinnitus and voice change. Negative for ear discharge, hearing loss, nosebleeds, rhinorrhea, sneezing, sore throat and trouble swallowing.   Eyes: Positive for discharge and itching. Negative for photophobia, pain, redness and visual disturbance.  Respiratory: Positive for cough, chest tightness and wheezing. Negative for apnea, choking, shortness of breath and stridor.   Gastrointestinal: Negative.   Musculoskeletal: Negative for neck pain.  Neurological: Positive for light-headedness and headaches. Negative for dizziness.    Social History  Substance Use Topics  . Smoking status: Former Smoker    Years: 1.00  . Smokeless tobacco: Never Used  . Alcohol use 0.0 oz/week     Comment: OCCASIONALLY   Objective:   BP 112/64 (BP Location: Left Arm, Patient Position: Sitting, Cuff Size: Normal)   Pulse 68   Temp 97.6 F (36.4 C) (Oral)   Resp 16   Wt 161 lb (73 kg)   BMI 30.93 kg/m  Vitals:   07/25/16 1042  BP: 112/64  Pulse: 68  Resp: 16  Temp: 97.6 F (36.4 C)  TempSrc: Oral  Weight: 161 lb (73 kg)     Physical Exam  Constitutional: She appears well-developed and well-nourished.  HENT:  Right Ear: External ear normal.  Left Ear: External ear normal.  Mouth/Throat: Oropharynx is clear and moist. No oropharyngeal exudate.  Erythematous canals bilaterally  Eyes: Right eye exhibits discharge. Left eye exhibits discharge.  Neck: Neck supple.  Cardiovascular: Normal rate and regular rhythm.   Pulmonary/Chest: Effort normal and breath sounds normal. No respiratory distress. She has no wheezes.  She has no rales.  Lymphadenopathy:    She has no cervical adenopathy.  Skin: Skin is warm and dry.  Psychiatric: She has a normal mood and affect. Her behavior is normal.        Assessment & Plan:     1. Viral upper respiratory illness  Viral right now. Treat symptomatically with warm salt water gargles, Tylenol Prn. Patient getting colonoscopy next week. Hard script for abx if worsening, but otherwise should not fill.  2. Laryngitis  Viral.  3. Sore throat  - amoxicillin (AMOXIL) 875 MG tablet; Take 1 tablet (875 mg total) by mouth 2 (two) times daily.  Dispense: 20 tablet; Refill: 0  4. Upper respiratory tract infection, unspecified type  Refilled for nasal edema/allergies.  - fluticasone (FLONASE) 50 MCG/ACT nasal spray; Place 2 sprays into both nostrils daily.  Dispense: 1 g; Refill: 0  Return if symptoms worsen or fail to improve.  The entirety of the information documented in the History of Present Illness, Review of Systems and Physical Exam were personally obtained by me. Portions of this information were initially documented by Ashley Royalty, CMA and reviewed by me for thoroughness and accuracy.         Trinna Post, PA-C  [facility]

## 2016-07-25 NOTE — Patient Instructions (Signed)
Upper Respiratory Infection, Adult Most upper respiratory infections (URIs) are caused by a virus. A URI affects the nose, throat, and upper air passages. The most common type of URI is often called "the common cold." Follow these instructions at home:  Take medicines only as told by your doctor.  Gargle warm saltwater or take cough drops to comfort your throat as told by your doctor.  Use a warm mist humidifier or inhale steam from a shower to increase air moisture. This may make it easier to breathe.  Drink enough fluid to keep your pee (urine) clear or pale yellow.  Eat soups and other clear broths.  Have a healthy diet.  Rest as needed.  Go back to work when your fever is gone or your doctor says it is okay. ? You may need to stay home longer to avoid giving your URI to others. ? You can also wear a face mask and wash your hands often to prevent spread of the virus.  Use your inhaler more if you have asthma.  Do not use any tobacco products, including cigarettes, chewing tobacco, or electronic cigarettes. If you need help quitting, ask your doctor. Contact a doctor if:  You are getting worse, not better.  Your symptoms are not helped by medicine.  You have chills.  You are getting more short of breath.  You have brown or red mucus.  You have yellow or brown discharge from your nose.  You have pain in your face, especially when you bend forward.  You have a fever.  You have puffy (swollen) neck glands.  You have pain while swallowing.  You have white areas in the back of your throat. Get help right away if:  You have very bad or constant: ? Headache. ? Ear pain. ? Pain in your forehead, behind your eyes, and over your cheekbones (sinus pain). ? Chest pain.  You have long-lasting (chronic) lung disease and any of the following: ? Wheezing. ? Long-lasting cough. ? Coughing up blood. ? A change in your usual mucus.  You have a stiff neck.  You have  changes in your: ? Vision. ? Hearing. ? Thinking. ? Mood. This information is not intended to replace advice given to you by your health care provider. Make sure you discuss any questions you have with your health care provider. Document Released: 07/16/2007 Document Revised: 09/30/2015 Document Reviewed: 05/04/2013 Elsevier Interactive Patient Education  2018 Elsevier Inc.  

## 2016-07-28 ENCOUNTER — Telehealth: Payer: Self-pay | Admitting: Gastroenterology

## 2016-07-28 NOTE — Telephone Encounter (Signed)
Patient left a voice message that her colonoscopy is 6/21 and she needs to reschedule due to a virus that she is being treated for. Please call

## 2016-07-30 ENCOUNTER — Telehealth: Payer: Self-pay | Admitting: Physician Assistant

## 2016-07-30 DIAGNOSIS — J069 Acute upper respiratory infection, unspecified: Secondary | ICD-10-CM

## 2016-07-30 MED ORDER — CEFDINIR 300 MG PO CAPS: 300.0000 mg | ORAL_CAPSULE | Freq: Two times a day (BID) | ORAL | 0 refills | Status: DC

## 2016-07-30 NOTE — Telephone Encounter (Signed)
Patient advised as below.  

## 2016-07-30 NOTE — Telephone Encounter (Signed)
Omnicef sent to [facility]

## 2016-07-30 NOTE — Telephone Encounter (Signed)
Pt was in last Thursday for a cough.  She has been taking the antibiotic since.   She does not feel like she is getting much better.  She uses [facility]   She thinks she needs another antibiotic  Her call back is [phone]  Thanks [employer]

## 2016-07-30 NOTE — Telephone Encounter (Signed)
Please review. Thanks!  

## 2016-08-07 ENCOUNTER — Telehealth: Payer: Self-pay

## 2016-08-07 ENCOUNTER — Ambulatory Visit
Admission: RE | Admit: 2016-08-07 | Discharge: 2016-08-07 | Disposition: A | Discharge status: Home / Self Care | Payer: 59 | Source: Ambulatory Visit | Attending: Physician Assistant | Admitting: Physician Assistant

## 2016-08-07 DIAGNOSIS — Z1231 Encounter for screening mammogram for malignant neoplasm of breast: Secondary | ICD-10-CM | POA: Diagnosis present

## 2016-08-07 DIAGNOSIS — Z1239 Encounter for other screening for malignant neoplasm of breast: Secondary | ICD-10-CM

## 2016-08-07 DIAGNOSIS — Z803 Family history of malignant neoplasm of breast: Secondary | ICD-10-CM

## 2016-08-07 NOTE — Telephone Encounter (Signed)
Patient advised.

## 2016-08-07 NOTE — Telephone Encounter (Signed)
-----   Message from Mar Daring, PA-C sent at 08/07/2016  1:59 PM EDT ----- Normal mammogram. Repeat screening in one year.

## 2016-08-12 ENCOUNTER — Telehealth: Payer: Self-pay | Admitting: Emergency Medicine

## 2016-08-12 NOTE — Telephone Encounter (Signed)
Pt calling back because she wants to know what she needs to do about her cough. She was treated with Amoxicillin on 07/25/16 by Melody Haver, then that did not help so she was called in Cefdinir on 07/30/16. She is feeling better but is still coughing and still having some nasal congestion. Please advise.    [place].

## 2016-08-12 NOTE — Telephone Encounter (Signed)
She should probably be re-evaluated to make sure she doesn't have something else going on since symptoms are persistent.

## 2016-08-12 NOTE — Telephone Encounter (Signed)
Patient advised as directed below.Scheduled for Thursday July 5th at 1:30 pm  Thanks,  -Ceylon Arenson

## 2016-08-14 ENCOUNTER — Encounter: Payer: Self-pay | Admitting: Physician Assistant

## 2016-08-14 ENCOUNTER — Ambulatory Visit (INDEPENDENT_AMBULATORY_CARE_PROVIDER_SITE_OTHER): Payer: 59 | Admitting: Physician Assistant

## 2016-08-14 VITALS — BP 110/70 | HR 79 | Temp 98.3°F | Resp 16 | Wt 162.8 lb

## 2016-08-14 DIAGNOSIS — J189 Pneumonia, unspecified organism: Secondary | ICD-10-CM

## 2016-08-14 MED ORDER — AZITHROMYCIN 250 MG PO TABS: ORAL_TABLET | ORAL | 0 refills | Status: DC

## 2016-08-14 MED ORDER — PREDNISONE 10 MG (21) PO TBPK: ORAL_TABLET | ORAL | 0 refills | Status: DC

## 2016-08-14 NOTE — Patient Instructions (Signed)
Prednisone tablets °What is this medicine? °PREDNISONE (PRED ni sone) is a corticosteroid. It is commonly used to treat inflammation of the skin, joints, lungs, and other organs. Common conditions treated include asthma, allergies, and arthritis. It is also used for other conditions, such as blood disorders and diseases of the adrenal glands. °This medicine may be used for other purposes; ask your health care provider or pharmacist if you have questions. °COMMON BRAND NAME(S): Deltasone, Predone, Sterapred, Sterapred DS °What should I tell my health care provider before I take this medicine? °They need to know if you have any of these conditions: °-Cushing's syndrome °-diabetes °-glaucoma °-heart disease °-high blood pressure °-infection (especially a virus infection such as chickenpox, cold sores, or herpes) °-kidney disease °-liver disease °-mental illness °-myasthenia gravis °-osteoporosis °-seizures °-stomach or intestine problems °-thyroid disease °-an unusual or allergic reaction to lactose, prednisone, other medicines, foods, dyes, or preservatives °-pregnant or trying to get pregnant °-breast-feeding °How should I use this medicine? °Take this medicine by mouth with a glass of water. Follow the directions on the prescription label. Take this medicine with food. If you are taking this medicine once a day, take it in the morning. Do not take more medicine than you are told to take. Do not suddenly stop taking your medicine because you may develop a severe reaction. Your doctor will tell you how much medicine to take. If your doctor wants you to stop the medicine, the dose may be slowly lowered over time to avoid any side effects. °Talk to your pediatrician regarding the use of this medicine in children. Special care may be needed. °Overdosage: If you think you have taken too much of this medicine contact a poison control center or emergency room at once. °NOTE: This medicine is only for you. Do not share this  medicine with others. °What if I miss a dose? °If you miss a dose, take it as soon as you can. If it is almost time for your next dose, talk to your doctor or health care professional. You may need to miss a dose or take an extra dose. Do not take double or extra doses without advice. °What may interact with this medicine? °Do not take this medicine with any of the following medications: °-metyrapone °-mifepristone °This medicine may also interact with the following medications: °-aminoglutethimide °-amphotericin B °-aspirin and aspirin-like medicines °-barbiturates °-certain medicines for diabetes, like glipizide or glyburide °-cholestyramine °-cholinesterase inhibitors °-cyclosporine °-digoxin °-diuretics °-ephedrine °-female hormones, like estrogens and birth control pills °-isoniazid °-ketoconazole °-NSAIDS, medicines for pain and inflammation, like ibuprofen or naproxen °-phenytoin °-rifampin °-toxoids °-vaccines °-warfarin °This list may not describe all possible interactions. Give your health care provider a list of all the medicines, herbs, non-prescription drugs, or dietary supplements you use. Also tell them if you smoke, drink alcohol, or use illegal drugs. Some items may interact with your medicine. °What should I watch for while using this medicine? °Visit your doctor or health care professional for regular checks on your progress. If you are taking this medicine over a prolonged period, carry an identification card with your name and address, the type and dose of your medicine, and your doctor's name and address. °This medicine may increase your risk of getting an infection. Tell your doctor or health care professional if you are around anyone with measles or chickenpox, or if you develop sores or blisters that do not heal properly. °If you are going to have surgery, tell your doctor or health care professional that   you have taken this medicine within the last twelve months. °Ask your doctor or health  care professional about your diet. You may need to lower the amount of salt you eat. °This medicine may affect blood sugar levels. If you have diabetes, check with your doctor or health care professional before you change your diet or the dose of your diabetic medicine. °What side effects may I notice from receiving this medicine? °Side effects that you should report to your doctor or health care professional as soon as possible: °-allergic reactions like skin rash, itching or hives, swelling of the face, lips, or tongue °-changes in emotions or moods °-changes in vision °-depressed mood °-eye pain °-fever or chills, cough, sore throat, pain or difficulty passing urine °-increased thirst °-swelling of ankles, feet °Side effects that usually do not require medical attention (report to your doctor or health care professional if they continue or are bothersome): °-confusion, excitement, restlessness °-headache °-nausea, vomiting °-skin problems, acne, thin and shiny skin °-trouble sleeping °-weight gain °This list may not describe all possible side effects. Call your doctor for medical advice about side effects. You may report side effects to FDA at 1-800-FDA-1088. °Where should I keep my medicine? °Keep out of the reach of children. °Store at room temperature between 15 and 30 degrees C (59 and 86 degrees F). Protect from light. Keep container tightly closed. Throw away any unused medicine after the expiration date. °NOTE: This sheet is a summary. It may not cover all possible information. If you have questions about this medicine, talk to your doctor, pharmacist, or health care provider. °© 2018 [employer]vier/Gold Standard (2010-09-12 10:57:14) ° °

## 2016-08-14 NOTE — Progress Notes (Signed)
Patient: Katherine White Female    DOB: 05/09/1955   61 y.o.   MRN: [id number] Visit Date: 08/14/2016  Today's Provider: Mar Daring, PA-C   Chief Complaint  Patient presents with  . Cough   Subjective:    HPI Patient here today with c/o persistent cough. She was treated with Amoxicillin on 06/15 that did not help and Cefdinir was called in 06/20.Patient reports feeling better but still coughing. She reports that her other URI symptoms, including nasal congestion, sinus pressure, and ear pain have all improved but she has continued to have this dry cough.    Allergies  Allergen Reactions  . Aspirin     GI upset (High dosage) Can take 81 mg  . Codeine      Current Outpatient Prescriptions:  .  aspirin 81 MG tablet, Take 1 tablet by mouth daily., Disp: , Rfl:  .  carvedilol (COREG) 3.125 MG tablet, Take 1 tablet by mouth daily., Disp: , Rfl:  .  Cholecalciferol (VITAMIN D) 2000 UNITS CAPS, Take 1 capsule by mouth daily., Disp: , Rfl:  .  fluticasone (FLONASE) 50 MCG/ACT nasal spray, Place 2 sprays into both nostrils daily., Disp: 1 g, Rfl: 0 .  Ivermectin (SOOLANTRA) 1 % CREA, Apply topically once daily., Disp: , Rfl:  .  loratadine (CLARITIN REDITABS) 10 MG dissolvable tablet, Take 1 tablet by mouth daily., Disp: , Rfl:  .  losartan (COZAAR) 25 MG tablet, Take 1 tablet by mouth daily., Disp: , Rfl:  .  Magnesium Citrate 100 MG TABS, Take 1 tablet by mouth daily. Reported on 06/05/2015, Disp: , Rfl:  .  MULTIPLE VITAMIN PO, Take 1 tablet by mouth daily., Disp: , Rfl:  .  nitroGLYCERIN (NITROSTAT) 0.4 MG SL tablet, Place 1 tablet under the tongue as needed. Reported on 06/05/2015, Disp: , Rfl:  .  pyridoxine (B6 NATURAL) 100 MG tablet, Take 1 tablet by mouth daily., Disp: , Rfl:  .  simvastatin (ZOCOR) 40 MG tablet, TAKE ONE-HALF TABLET DAILY, Disp: 30 tablet, Rfl: 5 .  tetrahydrozoline-zinc (EYE DROPS RELIEF) 0.05-0.25 % ophthalmic solution, Apply 1 drop to eye  daily., Disp: , Rfl:  .  cefdinir (OMNICEF) 300 MG capsule, Take 1 capsule (300 mg total) by mouth 2 (two) times daily. (Patient not taking: Reported on 08/14/2016), Disp: 20 capsule, Rfl: 0  Review of Systems  Constitutional: Negative for chills, fatigue and fever.  HENT: Positive for sore throat (feels irritated from coughing). Negative for congestion, ear pain, postnasal drip, rhinorrhea, sinus pain, sinus pressure and trouble swallowing.   Respiratory: Positive for cough. Negative for chest tightness, shortness of breath and wheezing.   Cardiovascular: Negative for chest pain, palpitations and leg swelling.  Gastrointestinal: Negative for abdominal pain.  Neurological: Negative for headaches.    Social History  Substance Use Topics  . Smoking status: Former Smoker    Years: 1.00  . Smokeless tobacco: Never Used  . Alcohol use 0.0 oz/week     Comment: OCCASIONALLY   Objective:   BP 110/70 (BP Location: Left Arm, Patient Position: Sitting, Cuff Size: Normal)   Pulse 79   Temp 98.3 F (36.8 C) (Oral)   Resp 16   Wt 162 lb 12.8 oz (73.8 kg)   SpO2 98%   BMI 31.27 kg/m     Physical Exam  Constitutional: She appears well-developed and well-nourished. No distress.  HENT:  Head: Normocephalic and atraumatic.  Right Ear: Hearing, tympanic membrane, external ear  and ear canal normal.  Left Ear: Hearing, tympanic membrane, external ear and ear canal normal.  Nose: Nose normal.  Mouth/Throat: Uvula is midline, oropharynx is clear and moist and mucous membranes are normal. No oropharyngeal exudate.  Eyes: Conjunctivae are normal. Pupils are equal, round, and reactive to light. Right eye exhibits no discharge. Left eye exhibits no discharge. No scleral icterus.  Neck: Normal range of motion. Neck supple. No tracheal deviation present. No thyromegaly present.  Cardiovascular: Normal rate, regular rhythm and normal heart sounds.  Exam reveals no gallop and no friction rub.   No murmur  heard. Pulmonary/Chest: Effort normal. No stridor. No respiratory distress. She has decreased breath sounds in the left lower field. She has no wheezes. She has no rhonchi. She has rales in the left lower field.  Lymphadenopathy:    She has no cervical adenopathy.  Skin: Skin is warm and dry. She is not diaphoretic.  Vitals reviewed.       Assessment & Plan:     1. Walking pneumonia I suspect she may have developed possible atypical pneumonia as she now as decreased breath sounds and some mild crackles in the left lung base. I will treat her with zpak and steroid taper as below. Offered CXR but patient declined at this time. She is to call back if no improvement after treatment and we will get CXR at that time.  - predniSONE (STERAPRED UNI-PAK 21 TAB) 10 MG (21) TBPK tablet; Take as directed on package instructions  Dispense: 21 tablet; Refill: 0 - azithromycin (ZITHROMAX) 250 MG tablet; Take 2 tablets PO on day one, and one tablet PO daily thereafter until completed.  Dispense: 6 tablet; Refill: 0       Mar Daring, PA-C  [facility]

## 2016-08-20 ENCOUNTER — Encounter: Payer: Self-pay | Admitting: *Deleted

## 2016-08-21 ENCOUNTER — Ambulatory Visit: Payer: 59 | Admitting: Anesthesiology

## 2016-08-21 ENCOUNTER — Encounter
Admission: RE | Disposition: A | Discharge status: Home / Self Care | Payer: Self-pay | Source: Ambulatory Visit | Attending: Gastroenterology

## 2016-08-21 ENCOUNTER — Encounter: Payer: Self-pay | Admitting: *Deleted

## 2016-08-21 ENCOUNTER — Ambulatory Visit
Admission: RE | Admit: 2016-08-21 | Discharge: 2016-08-21 | Disposition: A | Discharge status: Home / Self Care | Payer: 59 | Source: Ambulatory Visit | Attending: Gastroenterology | Admitting: Gastroenterology

## 2016-08-21 DIAGNOSIS — Z1211 Encounter for screening for malignant neoplasm of colon: Secondary | ICD-10-CM

## 2016-08-21 DIAGNOSIS — Z955 Presence of coronary angioplasty implant and graft: Secondary | ICD-10-CM | POA: Insufficient documentation

## 2016-08-21 DIAGNOSIS — Z87891 Personal history of nicotine dependence: Secondary | ICD-10-CM | POA: Insufficient documentation

## 2016-08-21 DIAGNOSIS — Z79899 Other long term (current) drug therapy: Secondary | ICD-10-CM | POA: Insufficient documentation

## 2016-08-21 DIAGNOSIS — Z7982 Long term (current) use of aspirin: Secondary | ICD-10-CM | POA: Insufficient documentation

## 2016-08-21 DIAGNOSIS — K6389 Other specified diseases of intestine: Secondary | ICD-10-CM | POA: Insufficient documentation

## 2016-08-21 DIAGNOSIS — I10 Essential (primary) hypertension: Secondary | ICD-10-CM | POA: Insufficient documentation

## 2016-08-21 DIAGNOSIS — D124 Benign neoplasm of descending colon: Secondary | ICD-10-CM | POA: Diagnosis not present

## 2016-08-21 DIAGNOSIS — D123 Benign neoplasm of transverse colon: Secondary | ICD-10-CM

## 2016-08-21 DIAGNOSIS — D122 Benign neoplasm of ascending colon: Secondary | ICD-10-CM | POA: Diagnosis not present

## 2016-08-21 DIAGNOSIS — I252 Old myocardial infarction: Secondary | ICD-10-CM | POA: Insufficient documentation

## 2016-08-21 DIAGNOSIS — K5730 Diverticulosis of large intestine without perforation or abscess without bleeding: Secondary | ICD-10-CM | POA: Diagnosis not present

## 2016-08-21 HISTORY — DX: Essential (primary) hypertension: I10

## 2016-08-21 HISTORY — PX: COLONOSCOPY WITH PROPOFOL: SHX5780

## 2016-08-21 SURGERY — COLONOSCOPY WITH PROPOFOL
Anesthesia: General

## 2016-08-21 MED ORDER — FENTANYL CITRATE (PF) 100 MCG/2ML IJ SOLN
INTRAMUSCULAR | Status: AC
  Filled 2016-08-21: qty 2

## 2016-08-21 MED ORDER — PROPOFOL 500 MG/50ML IV EMUL
INTRAVENOUS | Status: DC | PRN
  Administered 2016-08-21: 25 ug/kg/min via INTRAVENOUS

## 2016-08-21 MED ORDER — PROPOFOL 10 MG/ML IV BOLUS
INTRAVENOUS | Status: DC | PRN
Start: 2016-08-21 — End: 2016-08-21
  Administered 2016-08-21: 30 mg via INTRAVENOUS
  Administered 2016-08-21: 10 mg via INTRAVENOUS
  Administered 2016-08-21: 20 mg via INTRAVENOUS
  Administered 2016-08-21: 10 mg via INTRAVENOUS

## 2016-08-21 MED ORDER — MIDAZOLAM HCL 2 MG/2ML IJ SOLN
INTRAMUSCULAR | Status: AC
Start: 2016-08-21 — End: 2016-08-21
  Filled 2016-08-21: qty 2

## 2016-08-21 MED ORDER — FENTANYL CITRATE (PF) 100 MCG/2ML IJ SOLN
INTRAMUSCULAR | Status: DC | PRN
  Administered 2016-08-21 (×2): 50 ug via INTRAVENOUS

## 2016-08-21 MED ORDER — MIDAZOLAM HCL 5 MG/5ML IJ SOLN
INTRAMUSCULAR | Status: DC | PRN
  Administered 2016-08-21: 2 mg via INTRAVENOUS

## 2016-08-21 MED ORDER — LIDOCAINE HCL (PF) 2 % IJ SOLN
INTRAMUSCULAR | Status: AC
  Filled 2016-08-21: qty 2

## 2016-08-21 MED ORDER — LIDOCAINE HCL (PF) 2 % IJ SOLN
INTRAMUSCULAR | Status: DC | PRN
  Administered 2016-08-21: 50 mg

## 2016-08-21 MED ORDER — SODIUM CHLORIDE 0.9 % IV SOLN
INTRAVENOUS | Status: DC
  Administered 2016-08-21: 10:00:00 via INTRAVENOUS

## 2016-08-21 MED ORDER — PROPOFOL 500 MG/50ML IV EMUL
INTRAVENOUS | Status: AC
  Filled 2016-08-21: qty 50

## 2016-08-21 MED ORDER — PHENYLEPHRINE HCL 10 MG/ML IJ SOLN
INTRAMUSCULAR | Status: DC | PRN
  Administered 2016-08-21 (×4): 100 ug via INTRAVENOUS

## 2016-08-21 NOTE — Anesthesia Postprocedure Evaluation (Signed)
Anesthesia Post Note  Patient: Pasty Manninen Sarris  Procedure(s) Performed: Procedure(s) (LRB): COLONOSCOPY WITH PROPOFOL (N/A)  Patient location during evaluation: PACU Anesthesia Type: General Level of consciousness: awake and alert and oriented Pain management: pain level controlled Vital Signs Assessment: post-procedure vital signs reviewed and stable Respiratory status: spontaneous breathing, nonlabored ventilation and respiratory function stable Cardiovascular status: blood pressure returned to baseline and stable Postop Assessment: no signs of nausea or vomiting Anesthetic complications: no     Last Vitals:  Vitals:   08/21/16 1117 08/21/16 1127  BP: (!) 101/51 (!) 104/59  Pulse: 77 73  Resp: 10 14  Temp:      Last Pain:  Vitals:   08/21/16 1107  TempSrc: Tympanic                 Darald Uzzle

## 2016-08-21 NOTE — Transfer of Care (Signed)
Immediate Anesthesia Transfer of Care Note  Patient: Katherine White  Procedure(s) Performed: Procedure(s): COLONOSCOPY WITH PROPOFOL (N/A)  Patient Location: PACU  Anesthesia Type:General  Level of Consciousness: sedated  Airway & Oxygen Therapy: Patient Spontanous Breathing and Patient connected to nasal cannula oxygen  Post-op Assessment: Report given to RN and Post -op Vital signs reviewed and stable  Post vital signs: Reviewed and stable  Last Vitals:  Vitals:   08/21/16 0933 08/21/16 1107  BP: (!) 122/55 (!) 96/53  Pulse: 81 82  Resp:  11  Temp: (!) 35.7 C (!) 36.1 C    Last Pain:  Vitals:   08/21/16 1107  TempSrc: Tympanic         Complications: No apparent anesthesia complications

## 2016-08-21 NOTE — Anesthesia Preprocedure Evaluation (Signed)
Anesthesia Evaluation  Patient identified by MRN, date of birth, ID band Patient awake    Reviewed: Allergy & Precautions, NPO status , Patient's Chart, lab work & pertinent test results  History of Anesthesia Complications (+) PONV and history of anesthetic complications  Airway Mallampati: II  TM Distance: >3 FB Neck ROM: Full    Dental  (+) Implants   Pulmonary neg sleep apnea, neg COPD, former smoker,    breath sounds clear to auscultation- rhonchi (-) wheezing      Cardiovascular Exercise Tolerance: Good hypertension, Pt. on medications (-) Cardiac Stents and (-) CABG Past MI: hx of Takasubo in 2011.   Rhythm:Regular Rate:Normal - Systolic murmurs and - Diastolic murmurs    Neuro/Psych negative neurological ROS  negative psych ROS   GI/Hepatic negative GI ROS, Neg liver ROS,   Endo/Other  negative endocrine ROSneg diabetes  Renal/GU negative Renal ROS     Musculoskeletal  (+) Arthritis ,   Abdominal (+) - obese,   Peds  Hematology negative hematology ROS (+)   Anesthesia Other Findings Past Medical History: No date: Hypertension   Reproductive/Obstetrics                             Anesthesia Physical Anesthesia Plan  ASA: II  Anesthesia Plan: General   Post-op Pain Management:    Induction: Intravenous  PONV Risk Score and Plan: 2 and Propofol  Airway Management Planned: Natural Airway  Additional Equipment:   Intra-op Plan:   Post-operative Plan:   Informed Consent: I have reviewed the patients History and Physical, chart, labs and discussed the procedure including the risks, benefits and alternatives for the proposed anesthesia with the patient or authorized representative who has indicated his/her understanding and acceptance.   Dental advisory given  Plan Discussed with: CRNA and Anesthesiologist  Anesthesia Plan Comments:         Anesthesia Quick  Evaluation

## 2016-08-21 NOTE — Op Note (Signed)
[facility] Patient Name: Katherine White Procedure Date: 08/21/2016 10:27 AM MRN: [id number] Account #: [id number] Date of Birth: November 20, 1955 Admit Type: Outpatient Age: 61 Room: [facility] ENDO ROOM 4 Gender: Female Note Status: Finalized Procedure:            Colonoscopy Indications:          Screening for colorectal malignant neoplasm Providers:            Jonathon Bellows MD, MD Referring MD:         Mar Daring (Referring MD) Medicines:            Monitored Anesthesia Care Complications:        No immediate complications. Procedure:            Pre-Anesthesia Assessment:                       - Prior to the procedure, a History and Physical was                        performed, and patient medications, allergies and                        sensitivities were reviewed. The patient's tolerance of                        previous anesthesia was reviewed.                       - The risks and benefits of the procedure and the                        sedation options and risks were discussed with the                        patient. All questions were answered and informed                        consent was obtained.                       - ASA Grade Assessment: II - A patient with mild                        systemic disease.                       After obtaining informed consent, the colonoscope was                        passed under direct vision. Throughout the procedure,                        the patient's blood pressure, pulse, and oxygen                        saturations were monitored continuously. The                        Colonoscope was introduced through the anus and  advanced to the the cecum, identified by the                        appendiceal orifice, IC valve and transillumination.                        The colonoscopy was performed with ease. The patient                        tolerated the procedure well. The  quality of the bowel                        preparation was good. Findings:      The perianal and digital rectal examinations were normal.      A diffuse area of moderate melanosis was found in the entire colon.      Two sessile polyps were found in the hepatic flexure and ascending       colon. The polyps were 3 to 5 mm in size. These polyps were removed with       a cold biopsy forceps. Resection and retrieval were complete.      Two sessile polyps were found in the descending colon. The polyps were 3       to 5 mm in size. These polyps were removed with a cold biopsy forceps.       Resection and retrieval were complete.      The exam was otherwise without abnormality on direct and retroflexion       views.      A few small-mouthed diverticula were found in the sigmoid colon. Impression:           - Melanosis in the colon.                       - Two 3 to 5 mm polyps at the hepatic flexure and in                        the ascending colon, removed with a cold biopsy                        forceps. Resected and retrieved.                       - Two 3 to 5 mm polyps in the descending colon, removed                        with a cold biopsy forceps. Resected and retrieved.                       - The examination was otherwise normal on direct and                        retroflexion views.                       - Diverticulosis in the sigmoid colon. Recommendation:       - Discharge patient to home (with escort).                       - Resume previous diet.                       -  Continue present medications.                       - Await pathology results.                       - Repeat colonoscopy in 3 - 5 years for surveillance. Procedure Code(s):    --- Professional ---                       [phone], Colonoscopy, flexible; with biopsy, single or                        multiple Diagnosis Code(s):    --- Professional ---                       Z12.11, Encounter for screening for  malignant neoplasm                        of colon                       D12.3, Benign neoplasm of transverse colon (hepatic                        flexure or splenic flexure)                       K63.89, Other specified diseases of intestine                       D12.2, Benign neoplasm of ascending colon                       D12.4, Benign neoplasm of descending colon                       K57.30, Diverticulosis of large intestine without                        perforation or abscess without bleeding CPT copyright 2016 American Medical Association. All rights reserved. The codes documented in this report are preliminary and upon coder review may  be revised to meet current compliance requirements. Jonathon Bellows, MD Jonathon Bellows MD, MD 08/21/2016 11:05:59 AM This report has been signed electronically. Number of Addenda: 0 Note Initiated On: 08/21/2016 10:27 AM Scope Withdrawal Time: 0 hours 15 minutes 52 seconds  Total Procedure Duration: 0 hours 22 minutes 1 second       [facility]

## 2016-08-21 NOTE — H&P (Signed)
Jonathon Bellows MD [address], [place], [place] [address] Phone: [phone] Fax : [phone]  Primary Care Physician:  Mar Daring, PA-C Primary Gastroenterologist:  Dr. Jonathon Bellows   Pre-Procedure History & Physical: HPI:  Kataleia Quaranta is a 61 y.o. female is here for an colonoscopy.   Past Medical History:  Diagnosis Date  . Hypertension     Past Surgical History:  Procedure Laterality Date  . CARDIAC CATHETERIZATION  2011  . DILATION AND CURETTAGE OF UTERUS  1980, 1981   AFTER 2 MISCARRIAGES  . LEEP  1999  . TONSILLECTOMY    . TONSILLECTOMY AND ADENOIDECTOMY  1964    Prior to Admission medications   Medication Sig Start Date End Date Taking? Authorizing Provider  aspirin 81 MG tablet Take 1 tablet by mouth daily. 11/21/10  Yes [provider]  carvedilol (COREG) 3.125 MG tablet Take 1 tablet by mouth daily. 11/21/10  Yes [provider]  Cholecalciferol (VITAMIN D) 2000 UNITS CAPS Take 1 capsule by mouth daily.   Yes [provider]  fluticasone (FLONASE) 50 MCG/ACT nasal spray Place 2 sprays into both nostrils daily. 07/25/16  Yes Carles Collet M, PA-C  Ivermectin (SOOLANTRA) 1 % CREA Apply topically once daily.   Yes [provider]  loratadine (CLARITIN REDITABS) 10 MG dissolvable tablet Take 1 tablet by mouth daily.   Yes [provider]  losartan (COZAAR) 25 MG tablet Take 1 tablet by mouth daily.   Yes [provider]  Magnesium Citrate 100 MG TABS Take 1 tablet by mouth daily. Reported on 06/05/2015   Yes [provider]  MULTIPLE VITAMIN PO Take 1 tablet by mouth daily. 11/21/10  Yes [provider]  nitroGLYCERIN (NITROSTAT) 0.4 MG SL tablet Place 1 tablet under the tongue as needed. Reported on 06/05/2015 11/21/10  Yes [provider]  pyridoxine (B6 NATURAL) 100 MG tablet Take 1 tablet by mouth daily.   Yes [provider]  simvastatin (ZOCOR) 40 MG  tablet TAKE ONE-HALF TABLET DAILY 02/14/16  Yes Burnette, Clearnce Sorrel, PA-C  tetrahydrozoline-zinc (EYE DROPS RELIEF) 0.05-0.25 % ophthalmic solution Apply 1 drop to eye daily. 11/21/10  Yes [provider]  azithromycin (ZITHROMAX) 250 MG tablet Take 2 tablets PO on day one, and one tablet PO daily thereafter until completed. Patient not taking: Reported on 08/21/2016 08/14/16   Mar Daring, PA-C  predniSONE (STERAPRED UNI-PAK 21 TAB) 10 MG (21) TBPK tablet Take as directed on package instructions Patient not taking: Reported on 08/21/2016 08/14/16   Mar Daring, PA-C    Allergies as of 06/20/2016 - Review Complete 06/05/2016  Allergen Reaction Noted  . Aspirin  08/08/2014  . Codeine  08/08/2014    Family History  Problem Relation Age of Onset  . Hypertension Brother   . Breast cancer Maternal Aunt   . Diabetes Paternal Uncle   . Diabetes Maternal Grandmother   . Pneumonia Paternal Grandmother   . Coronary artery disease Mother   . Liver cancer Mother   . Heart disease Mother   . Breast cancer Mother 83  . Heart disease Father   . Bladder Cancer Father   . Heart attack Maternal Grandfather   . Hyperlipidemia Other   . Diabetes Other   . Heart disease Other   . Breast cancer Other        great aunt  . Breast cancer Cousin     Social History   Social History  . Marital  status: Married    Spouse name: Belenda Cruise  . Number of children: 4  . Years of education: some college   Occupational History  .  Retired   Social History Main Topics  . Smoking status: Former Smoker    Years: 1.00  . Smokeless tobacco: Never Used  . Alcohol use 0.0 oz/week     Comment: OCCASIONALLY  . Drug use: No  . Sexual activity: Yes   Other Topics Concern  . Not on file   Social History Narrative  . No narrative on file    Review of Systems: See HPI, otherwise negative ROS  Physical Exam: BP (!) 122/55   Pulse 81   Temp (!) 96.2 F (35.7 C) (Tympanic)   Ht 5'  0.5" (1.537 m)   Wt 156 lb (70.8 kg)   SpO2 100%   BMI 29.97 kg/m  General:   Alert,  pleasant and cooperative in NAD Head:  Normocephalic and atraumatic. Neck:  Supple; no masses or thyromegaly. Lungs:  Clear throughout to auscultation.    Heart:  Regular rate and rhythm. Abdomen:  Soft, nontender and nondistended. Normal bowel sounds, without guarding, and without rebound.   Neurologic:  Alert and  oriented x4;  grossly normal neurologically.  Impression/Plan: Sarita Haver is here for an colonoscopy to be performed for Screening colonoscopy average risk    Risks, benefits, limitations, and alternatives regarding  colonoscopy have been reviewed with the patient.  Questions have been answered.  All parties agreeable.   Jonathon Bellows, MD  08/21/2016, 10:31 AM

## 2016-08-21 NOTE — Anesthesia Post-op Follow-up Note (Cosign Needed)
Anesthesia QCDR form completed.        

## 2016-08-22 ENCOUNTER — Encounter: Payer: Self-pay | Admitting: Gastroenterology

## 2016-08-22 LAB — SURGICAL PATHOLOGY

## 2016-08-25 ENCOUNTER — Other Ambulatory Visit: Payer: Self-pay

## 2016-08-25 ENCOUNTER — Encounter: Payer: Self-pay | Admitting: Gastroenterology

## 2016-09-04 ENCOUNTER — Encounter: Payer: Self-pay | Admitting: Physician Assistant

## 2016-09-04 ENCOUNTER — Ambulatory Visit (INDEPENDENT_AMBULATORY_CARE_PROVIDER_SITE_OTHER): Payer: 59 | Admitting: Physician Assistant

## 2016-09-04 VITALS — BP 104/70 | HR 98 | Temp 98.3°F | Resp 16 | Wt 159.6 lb

## 2016-09-04 DIAGNOSIS — J0140 Acute pansinusitis, unspecified: Secondary | ICD-10-CM | POA: Diagnosis not present

## 2016-09-04 DIAGNOSIS — R059 Cough, unspecified: Secondary | ICD-10-CM

## 2016-09-04 DIAGNOSIS — R05 Cough: Secondary | ICD-10-CM

## 2016-09-04 MED ORDER — CEFDINIR 300 MG PO CAPS: 300.0000 mg | ORAL_CAPSULE | Freq: Two times a day (BID) | ORAL | 0 refills | Status: DC

## 2016-09-04 MED ORDER — PSEUDOEPH-BROMPHEN-DM 30-2-10 MG/5ML PO SYRP: 5.0000 mL | ORAL_SOLUTION | Freq: Three times a day (TID) | ORAL | 0 refills | Status: DC | PRN

## 2016-09-04 NOTE — Progress Notes (Signed)
Patient: Katherine White Female    DOB: [birth year]   61 y.o.   MRN: [id number] Visit Date: 09/04/2016  Today's Provider: Mar Daring, PA-C   Chief Complaint  Patient presents with  . Cough   Subjective:    Cough  This is a new problem. The current episode started yesterday. The problem has been gradually worsening. The cough is productive of sputum. Associated symptoms include headaches and postnasal drip. Pertinent negatives include no chest pain, chills, ear congestion, ear pain, fever, heartburn, rash, rhinorrhea, sore throat, shortness of breath or wheezing. Nothing aggravates the symptoms. She has tried OTC cough suppressant (Mucinex ) for the symptoms. The treatment provided no relief.      Allergies  Allergen Reactions  . Aspirin     GI upset (High dosage) Can take 81 mg  . Codeine      Current Outpatient Prescriptions:  .  aspirin 81 MG tablet, Take 1 tablet by mouth daily., Disp: , Rfl:  .  carvedilol (COREG) 3.125 MG tablet, Take 1 tablet by mouth daily., Disp: , Rfl:  .  Cholecalciferol (VITAMIN D) 2000 UNITS CAPS, Take 1 capsule by mouth daily., Disp: , Rfl:  .  fluticasone (FLONASE) 50 MCG/ACT nasal spray, Place 2 sprays into both nostrils daily., Disp: 1 g, Rfl: 0 .  Ivermectin (SOOLANTRA) 1 % CREA, Apply topically once daily., Disp: , Rfl:  .  loratadine (CLARITIN REDITABS) 10 MG dissolvable tablet, Take 1 tablet by mouth daily., Disp: , Rfl:  .  losartan (COZAAR) 25 MG tablet, Take 1 tablet by mouth daily., Disp: , Rfl:  .  Magnesium Citrate 100 MG TABS, Take 1 tablet by mouth daily. Reported on 06/05/2015, Disp: , Rfl:  .  MULTIPLE VITAMIN PO, Take 1 tablet by mouth daily., Disp: , Rfl:  .  pyridoxine (B6 NATURAL) 100 MG tablet, Take 1 tablet by mouth daily., Disp: , Rfl:  .  simvastatin (ZOCOR) 40 MG tablet, TAKE ONE-HALF TABLET DAILY, Disp: 30 tablet, Rfl: 5 .  tetrahydrozoline-zinc (EYE DROPS RELIEF) 0.05-0.25 % ophthalmic solution, Apply 1  drop to eye daily., Disp: , Rfl:  .  azithromycin (ZITHROMAX) 250 MG tablet, Take 2 tablets PO on day one, and one tablet PO daily thereafter until completed. (Patient not taking: Reported on 08/21/2016), Disp: 6 tablet, Rfl: 0 .  nitroGLYCERIN (NITROSTAT) 0.4 MG SL tablet, Place 1 tablet under the tongue as needed. Reported on 06/05/2015, Disp: , Rfl:  .  predniSONE (STERAPRED UNI-PAK 21 TAB) 10 MG (21) TBPK tablet, Take as directed on package instructions (Patient not taking: Reported on 08/21/2016), Disp: 21 tablet, Rfl: 0  Review of Systems  Constitutional: Positive for fatigue. Negative for chills and fever.  HENT: Positive for congestion, postnasal drip, sinus pressure and voice change. Negative for ear pain, rhinorrhea, sneezing, sore throat and trouble swallowing.   Respiratory: Positive for cough. Negative for chest tightness, shortness of breath and wheezing.   Cardiovascular: Negative for chest pain, palpitations and leg swelling.  Gastrointestinal: Positive for diarrhea (Today). Negative for heartburn.  Musculoskeletal: Negative for back pain.  Skin: Negative for rash.  Neurological: Positive for headaches. Negative for dizziness and light-headedness.    Social History  Substance Use Topics  . Smoking status: Former Smoker    Years: 1.00  . Smokeless tobacco: Never Used  . Alcohol use 0.0 oz/week     Comment: OCCASIONALLY   Objective:   BP 104/70 (BP Location: Left Arm, Patient Position:  Sitting, Cuff Size: Normal)   Pulse 98   Temp 98.3 F (36.8 C) (Oral)   Resp 16   Wt 159 lb 9.6 oz (72.4 kg)   SpO2 98%   BMI 30.66 kg/m  Vitals:   09/04/16 1009  BP: 104/70  Pulse: 98  Resp: 16  Temp: 98.3 F (36.8 C)  TempSrc: Oral  SpO2: 98%  Weight: 159 lb 9.6 oz (72.4 kg)     Physical Exam  Constitutional: She appears well-developed and well-nourished. No distress.  HENT:  Head: Normocephalic and atraumatic.  Right Ear: Hearing, tympanic membrane, external ear and ear  canal normal.  Left Ear: Hearing, tympanic membrane, external ear and ear canal normal.  Nose: Right sinus exhibits maxillary sinus tenderness. Right sinus exhibits no frontal sinus tenderness. Left sinus exhibits maxillary sinus tenderness. Left sinus exhibits no frontal sinus tenderness.  Mouth/Throat: Uvula is midline, oropharynx is clear and moist and mucous membranes are normal. No oropharyngeal exudate.  Ethmoid tenderness also  Neck: Normal range of motion. Neck supple. No tracheal deviation present. No thyromegaly present.  Cardiovascular: Normal rate, regular rhythm and normal heart sounds.  Exam reveals no gallop and no friction rub.   No murmur heard. Pulmonary/Chest: Effort normal and breath sounds normal. No stridor. No respiratory distress. She has no wheezes. She has no rales.  Lymphadenopathy:    She has no cervical adenopathy.  Skin: She is not diaphoretic.  Vitals reviewed.      Assessment & Plan:     1. Acute pansinusitis, recurrence not specified Worsening symptoms that have not responded to OTC medications. Will give Cefdinir as below. Continue allergy medications. Stay well hydrated and get plenty of rest. Call if no symptom improvement or if symptoms worsen. - cefdinir (OMNICEF) 300 MG capsule; Take 1 capsule (300 mg total) by mouth 2 (two) times daily.  Dispense: 20 capsule; Refill: 0  2. Cough WIll give Bromfed-DM cough syrup as below. Push fluids. Call if no improvement.  - brompheniramine-pseudoephedrine-DM 30-2-10 MG/5ML syrup; Take 5 mLs by mouth 3 (three) times daily as needed.  Dispense: 120 mL; Refill: 0       Mar Daring, PA-C  [facility] GroupDictation #1 [id number]  BPZ:[id number]

## 2016-09-04 NOTE — Patient Instructions (Signed)

## 2016-12-06 ENCOUNTER — Ambulatory Visit (INDEPENDENT_AMBULATORY_CARE_PROVIDER_SITE_OTHER): Payer: 59 | Admitting: Family Medicine

## 2016-12-06 VITALS — BP 108/62 | HR 78 | Temp 98.3°F | Resp 14 | Wt 151.0 lb

## 2016-12-06 DIAGNOSIS — R05 Cough: Secondary | ICD-10-CM | POA: Diagnosis not present

## 2016-12-06 DIAGNOSIS — J069 Acute upper respiratory infection, unspecified: Secondary | ICD-10-CM | POA: Diagnosis not present

## 2016-12-06 DIAGNOSIS — J40 Bronchitis, not specified as acute or chronic: Secondary | ICD-10-CM | POA: Diagnosis not present

## 2016-12-06 DIAGNOSIS — R059 Cough, unspecified: Secondary | ICD-10-CM

## 2016-12-06 MED ORDER — AZITHROMYCIN 250 MG PO TABS: ORAL_TABLET | ORAL | 0 refills | Status: DC

## 2016-12-06 MED ORDER — FLUTICASONE PROPIONATE 50 MCG/ACT NA SUSP: 2.0000 | Freq: Every day | NASAL | 3 refills | Status: DC

## 2016-12-06 NOTE — Progress Notes (Signed)
Katherine White  MRN: [id number] DOB: 29-Aug-1955  Subjective:  HPI   The patient is a 61 year old female who presents for evaluation of cough and chest congestion for 1 week.  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  One of the children has been sick with cough as well as 2-3 adults in the family that she is in contact with have been diagnosed with bronchitits. She started with a sore throat a week ago which has resolved, but cough has gotten much worse the last two days which is keeping her up at night and now productive yellow sputum. No fevers, chills sweats, nausea, vomiting or dyspnea.   Patient Active Problem List   Diagnosis Date Noted  . Family history of breast cancer in mother 06/05/2016  . Personal history of other diseases of the circulatory system 11/17/2014  . Atypical chest pain 11/17/2014  . Angina pectoris ([place]) 11/17/2014  . Takotsubo cardiomyopathy 11/17/2014  . Trouble in sleeping 11/17/2014  . Abortion, spontaneous 08/09/2014  . Arthritis 08/09/2014  . Arteriosclerosis of coronary artery 08/09/2014  . Alopecia 08/09/2014  . BP (high blood pressure) 08/09/2014  . Adiposity 08/09/2014  . Hypercholesterolemia without hypertriglyceridemia 08/09/2014  . Avitaminosis D 08/09/2014  . Myocardial infarction ([place]) 01/16/2010    Past Medical History:  Diagnosis Date  . Hypertension     Social History   Social History  . Marital status: Married    Spouse name: Belenda Cruise  . Number of children: 4  . Years of education: some college   Occupational History  .  Retired   Social History Main Topics  . Smoking status: Former Smoker    Years: 1.00  . Smokeless tobacco: Never Used  . Alcohol use 0.0 oz/week     Comment: OCCASIONALLY  . Drug use: No  . Sexual activity: Yes   Other Topics Concern  . Not on file   Social History Narrative  . No narrative on file    Outpatient Encounter Prescriptions as of 12/06/2016  Medication Sig Note  .  aspirin 81 MG tablet Take 1 tablet by mouth daily. 08/09/2014: Received from: [place]: Take by mouth.  . carvedilol (COREG) 3.125 MG tablet Take 1 tablet by mouth daily. 08/09/2014: Received from: [place]: Take by mouth.  . Cholecalciferol (VITAMIN D) 2000 UNITS CAPS Take 1 capsule by mouth daily. 08/09/2014: Received from: [place]: Take by mouth.  . fluticasone (FLONASE) 50 MCG/ACT nasal spray Place 2 sprays into both nostrils daily.   . Ivermectin (SOOLANTRA) 1 % CREA Apply topically once daily. 11/17/2014: Received from: [place]  . loratadine (CLARITIN REDITABS) 10 MG dissolvable tablet Take 1 tablet by mouth daily. 08/09/2014: Received from: [place]: Take by mouth.  . losartan (COZAAR) 25 MG tablet Take 1 tablet by mouth daily. 08/09/2014: Received from: [place]: Take by mouth.  . Magnesium Citrate 100 MG TABS Take 1 tablet by mouth daily. Reported on 06/05/2015 08/09/2014: Received from: [place]: Take by mouth.  . MULTIPLE VITAMIN PO Take 1 tablet by mouth daily. 08/09/2014: Received from: [place]: Take by mouth.  . nitroGLYCERIN (NITROSTAT) 0.4 MG SL tablet Place 1 tablet under the tongue as needed. Reported on 06/05/2015 08/09/2014: Received from: [place]: Place under the tongue.  . pyridoxine (B6 NATURAL) 100  MG tablet Take 1 tablet by mouth daily. 08/09/2014: Received from: [place]: Take by mouth.  . simvastatin (ZOCOR) 40 MG tablet TAKE ONE-HALF TABLET DAILY   . tetrahydrozoline-zinc (EYE DROPS RELIEF) 0.05-0.25 % ophthalmic solution Apply 1 drop to eye daily. 08/09/2014: Received from: [place]: Apply to eye.  . [DISCONTINUED]  brompheniramine-pseudoephedrine-DM 30-2-10 MG/5ML syrup Take 5 mLs by mouth 3 (three) times daily as needed.   . [DISCONTINUED] cefdinir (OMNICEF) 300 MG capsule Take 1 capsule (300 mg total) by mouth 2 (two) times daily.    No facility-administered encounter medications on file as of 12/06/2016.     Allergies  Allergen Reactions  . Aspirin Other (See Comments)    Cannot tolerate large doses GI upset (High dosage) Can take 81 mg  . Codeine   . Codeine Sulfate Other (See Comments)    Review of Systems  Constitutional: Positive for malaise/fatigue. Negative for chills, diaphoresis and fever.  HENT: Positive for ear pain and sore throat (in the beginning). Negative for congestion, ear discharge, hearing loss, nosebleeds, sinus pain and tinnitus.   Eyes: Negative for blurred vision, double vision, photophobia, pain, discharge and redness.  Respiratory: Positive for cough, shortness of breath and wheezing. Negative for sputum production.   Cardiovascular: Negative for chest pain, palpitations and orthopnea.  Neurological: Negative for weakness.    Objective:  BP 108/62 (BP Location: Left Arm, Patient Position: Sitting, Cuff Size: Normal)   Pulse 78   Temp 98.3 F (36.8 C) (Oral)   Resp 14   Wt 151 lb (68.5 kg)   SpO2 99%   BMI 29.00 kg/m   Physical Exam   General Appearance:    Alert, cooperative, no distress  Eyes:    PERRL, conjunctiva/corneas clear, EOM's intact       Lungs:     Occasional expiratory wheeze, no rales, bilaterally, respirations unlabored  Heart:    Regular rate and rhythm  Neurologic:   Awake, alert, oriented x 3. No apparent focal neurological           defect.        Assessment and Plan :   1. Bronchitis  - azithromycin (ZITHROMAX) 250 MG tablet; 2 by mouth today, then 1 daily for 4 days  Dispense: 6 tablet; Refill: 0 - fluticasone (FLONASE) 50 MCG/ACT nasal spray; Place 2 sprays into both nostrils daily.  Dispense: 16 g; Refill: 3  2.  Cough Recommend OTC guaifenesin    Call if symptoms change or if not rapidly improving.

## 2016-12-12 ENCOUNTER — Telehealth: Payer: Self-pay | Admitting: Physician Assistant

## 2016-12-12 MED ORDER — PREDNISONE 10 MG PO TABS: ORAL_TABLET | ORAL | 0 refills | Status: DC

## 2016-12-12 NOTE — Telephone Encounter (Signed)
Pt stated she saw Dr. Caryn Section last Saturday 12/06/16 for OV. Pt stated she finished the azithromycin (ZITHROMAX) 250 MG tablet on 12/10/16 and still isn't feeling any better. Pt stated that she since keeps her young grand babies. Pt stated that even thou she saw Dr. Caryn Section she would rather discuss this with Tawanna Sat b/c knows her better. [place]. Please advise. Thanks TNP

## 2016-12-12 NOTE — Telephone Encounter (Signed)
Will send prednisone taper to see if this helps with inflammation and cough.

## 2016-12-12 NOTE — Telephone Encounter (Signed)
lmtcb-Anastasiya V Hopkins, RMA  

## 2016-12-12 NOTE — Telephone Encounter (Signed)
Patient called back and I got more information about her symptoms. Symptoms still present are cough-dry-off and on-harsh, chest congestion and feels rattling in there, slight SOB with going up and down steps if she has to but not severe enough to where she feels its a big issue, some post nasal drainage present, some sneezing. No fever, no chills, no sore throat. Headache present off and on due to the cough. Besides finishing Zpak she also has taking Mucinex-regular for chest congestion, and used nasal spray. She has waking up with the cough but cough not necessarily keeping her up at [facility], RMA

## 2016-12-12 NOTE — Telephone Encounter (Signed)
Pt advised as below-Anastasiya V Hopkins, RMA

## 2016-12-25 ENCOUNTER — Ambulatory Visit: Payer: 59 | Admitting: Physician Assistant

## 2016-12-25 ENCOUNTER — Encounter: Payer: Self-pay | Admitting: Physician Assistant

## 2016-12-25 ENCOUNTER — Ambulatory Visit
Admission: RE | Admit: 2016-12-25 | Discharge: 2016-12-25 | Disposition: A | Discharge status: Home / Self Care | Payer: 59 | Source: Ambulatory Visit | Attending: Physician Assistant | Admitting: Physician Assistant

## 2016-12-25 VITALS — BP 84/44 | HR 76 | Temp 98.2°F | Resp 16 | Wt 151.2 lb

## 2016-12-25 DIAGNOSIS — J90 Pleural effusion, not elsewhere classified: Secondary | ICD-10-CM | POA: Diagnosis not present

## 2016-12-25 DIAGNOSIS — R05 Cough: Secondary | ICD-10-CM | POA: Insufficient documentation

## 2016-12-25 DIAGNOSIS — R062 Wheezing: Secondary | ICD-10-CM

## 2016-12-25 DIAGNOSIS — J40 Bronchitis, not specified as acute or chronic: Secondary | ICD-10-CM | POA: Insufficient documentation

## 2016-12-25 DIAGNOSIS — J9811 Atelectasis: Secondary | ICD-10-CM | POA: Insufficient documentation

## 2016-12-25 DIAGNOSIS — R059 Cough, unspecified: Secondary | ICD-10-CM

## 2016-12-25 MED ORDER — ALBUTEROL SULFATE HFA 108 (90 BASE) MCG/ACT IN AERS: 1.0000 | INHALATION_SPRAY | RESPIRATORY_TRACT | 0 refills | Status: DC | PRN

## 2016-12-25 MED ORDER — AZITHROMYCIN 250 MG PO TABS: ORAL_TABLET | ORAL | 0 refills | Status: DC

## 2016-12-25 NOTE — Progress Notes (Signed)
Patient: Katherine White Female    DOB: 1955/12/16   61 y.o.   MRN: [id number] Visit Date: 12/25/2016  Today's Provider: Mar Daring, PA-C   Chief Complaint  Patient presents with  . Cough   Subjective:    HPI  Patient is here for follow up from last office visit on 12/06/16 with Dr Caryn Section. Patient was diagnosed with bronchitis. Treated with Zpak, Flonase spray and advised to take OTC guaifenesin for cough. On 12/12/16 patient called our clinic and was not feeling good still so Prednisone taper was sent in. Patient is done with Zpak and Prednisone and feels much better. Cough is still present and runny nose. Cough is still non productive as it has been. She is using Flonase spray. Mucinex she did try and that did not help at all.     Allergies  Allergen Reactions  . Aspirin Other (See Comments)    Cannot tolerate large doses GI upset (High dosage) Can take 81 mg  . Codeine   . Codeine Sulfate Other (See Comments)     Current Outpatient Medications:  .  aspirin 81 MG tablet, Take 1 tablet by mouth daily., Disp: , Rfl:  .  carvedilol (COREG) 3.125 MG tablet, Take 1 tablet by mouth daily., Disp: , Rfl:  .  Cholecalciferol (VITAMIN D) 2000 UNITS CAPS, Take 1 capsule by mouth daily., Disp: , Rfl:  .  fluticasone (FLONASE) 50 MCG/ACT nasal spray, Place 2 sprays into both nostrils daily., Disp: 16 g, Rfl: 3 .  Ivermectin (SOOLANTRA) 1 % CREA, Apply topically once daily., Disp: , Rfl:  .  loratadine (CLARITIN REDITABS) 10 MG dissolvable tablet, Take 1 tablet by mouth daily., Disp: , Rfl:  .  losartan (COZAAR) 25 MG tablet, Take 1 tablet by mouth daily., Disp: , Rfl:  .  Magnesium Citrate 100 MG TABS, Take 1 tablet by mouth daily. Reported on 06/05/2015, Disp: , Rfl:  .  MULTIPLE VITAMIN PO, Take 1 tablet by mouth daily., Disp: , Rfl:  .  nitroGLYCERIN (NITROSTAT) 0.4 MG SL tablet, Place 1 tablet under the tongue as needed. Reported on 06/05/2015, Disp: , Rfl:  .   pyridoxine (B6 NATURAL) 100 MG tablet, Take 1 tablet by mouth daily., Disp: , Rfl:  .  simvastatin (ZOCOR) 40 MG tablet, TAKE ONE-HALF TABLET DAILY, Disp: 30 tablet, Rfl: 5 .  tetrahydrozoline-zinc (EYE DROPS RELIEF) 0.05-0.25 % ophthalmic solution, Apply 1 drop to eye daily., Disp: , Rfl:   Review of Systems  Constitutional: Positive for fatigue. Negative for fever.  HENT: Positive for congestion, postnasal drip and rhinorrhea. Negative for ear pain, sinus pressure, sinus pain, sneezing, sore throat and tinnitus.   Respiratory: Positive for cough. Negative for chest tightness, shortness of breath and wheezing.   Cardiovascular: Negative.   Gastrointestinal: Negative.   Neurological: Positive for headaches. Negative for dizziness and light-headedness.    Social History   Tobacco Use  . Smoking status: Former Smoker    Years: 1.00  . Smokeless tobacco: Never Used  Substance Use Topics  . Alcohol use: Yes    Alcohol/week: 0.0 oz    Comment: OCCASIONALLY   Objective:   BP (!) 84/44   Pulse 76   Temp 98.2 F (36.8 C)   Resp 16   Wt 151 lb 3.2 oz (68.6 kg)   SpO2 96%   BMI 29.04 kg/m    Physical Exam  Constitutional: She appears well-developed and well-nourished. No distress.  HENT:  Head: Normocephalic and atraumatic.  Right Ear: Hearing, tympanic membrane, external ear and ear canal normal.  Left Ear: Hearing, tympanic membrane, external ear and ear canal normal.  Nose: Nose normal.  Mouth/Throat: Uvula is midline, oropharynx is clear and moist and mucous membranes are normal. No oropharyngeal exudate.  Eyes: Conjunctivae are normal. Pupils are equal, round, and reactive to light. Right eye exhibits no discharge. Left eye exhibits no discharge. No scleral icterus.  Neck: Normal range of motion. Neck supple. No tracheal deviation present. No thyromegaly present.  Cardiovascular: Normal rate, regular rhythm and normal heart sounds. Exam reveals no gallop and no friction rub.    No murmur heard. Pulmonary/Chest: Effort normal. No stridor. No respiratory distress. She has wheezes (expiratory heard in left lung base). She has no rales.  Lymphadenopathy:    She has no cervical adenopathy.  Skin: Skin is warm and dry. She is not diaphoretic.  Vitals reviewed.      Assessment & Plan:     1. Cough Will treat with zpak as below to cover atypical sources for pneumonia. Will get CXR due to continued symptoms. I will f/u pending results. Albuterol inhaler sent in to avoid another round of oral steroids. She is to call if symptoms worsen. - azithromycin (ZITHROMAX) 250 MG tablet; Take 2 tablets PO on day one, and one tablet PO daily thereafter until completed.  Dispense: 6 tablet; Refill: 0 - DG Chest 2 View; Future - albuterol (PROVENTIL HFA;VENTOLIN HFA) 108 (90 Base) MCG/ACT inhaler; Inhale 1-2 puffs every 4 (four) hours as needed into the lungs for wheezing or shortness of breath.  Dispense: 1 Inhaler; Refill: 0  2. Wheeze See above medical treatment plan. - azithromycin (ZITHROMAX) 250 MG tablet; Take 2 tablets PO on day one, and one tablet PO daily thereafter until completed.  Dispense: 6 tablet; Refill: 0 - DG Chest 2 View; Future - albuterol (PROVENTIL HFA;VENTOLIN HFA) 108 (90 Base) MCG/ACT inhaler; Inhale 1-2 puffs every 4 (four) hours as needed into the lungs for wheezing or shortness of breath.  Dispense: 1 Inhaler; Refill: 0  3. Bronchitis See above medical treatment plan. - azithromycin (ZITHROMAX) 250 MG tablet; Take 2 tablets PO on day one, and one tablet PO daily thereafter until completed.  Dispense: 6 tablet; Refill: 0 - DG Chest 2 View; Future - albuterol (PROVENTIL HFA;VENTOLIN HFA) 108 (90 Base) MCG/ACT inhaler; Inhale 1-2 puffs every 4 (four) hours as needed into the lungs for wheezing or shortness of breath.  Dispense: 1 Inhaler; Refill: 0       Mar Daring, PA-C  [facility]

## 2016-12-25 NOTE — Patient Instructions (Signed)

## 2017-01-28 ENCOUNTER — Ambulatory Visit (INDEPENDENT_AMBULATORY_CARE_PROVIDER_SITE_OTHER): Payer: 59 | Admitting: Physician Assistant

## 2017-01-28 ENCOUNTER — Encounter: Payer: Self-pay | Admitting: Physician Assistant

## 2017-01-28 VITALS — BP 122/70 | HR 80 | Temp 98.9°F | Resp 16 | Wt 149.2 lb

## 2017-01-28 DIAGNOSIS — Z20818 Contact with and (suspected) exposure to other bacterial communicable diseases: Secondary | ICD-10-CM

## 2017-01-28 NOTE — Progress Notes (Signed)
Patient: Katherine White Female    DOB: 07-Oct-1955   61 y.o.   MRN: [id number] Visit Date: 01/28/2017  Today's Provider: Mar Daring, PA-C   No chief complaint on file.  Subjective:    HPI Patient coming in today with c/o exposure to MRSA. Her father-in-law was diagnosed last month with MRSA and passed away yesterday. She reports that her father-in-law had sepsis from the MRSA. She reports that she doesn't have any lesions. She reports that she just haven't been feeling well. Also has not been sleeping great with having to run to [facility] for her father-in-law. Then yesterday her son fell off a ladder and broke his leg requiring surgery in [place].      Allergies  Allergen Reactions  . Aspirin Other (See Comments)    Cannot tolerate large doses GI upset (High dosage) Can take 81 mg  . Codeine   . Codeine Sulfate Other (See Comments)     Current Outpatient Medications:  .  aspirin 81 MG tablet, Take 1 tablet by mouth daily., Disp: , Rfl:  .  carvedilol (COREG) 3.125 MG tablet, Take 1 tablet by mouth daily., Disp: , Rfl:  .  Cholecalciferol (VITAMIN D) 2000 UNITS CAPS, Take 1 capsule by mouth daily., Disp: , Rfl:  .  fluticasone (FLONASE) 50 MCG/ACT nasal spray, Place 2 sprays into both nostrils daily., Disp: 16 g, Rfl: 3 .  Ivermectin (SOOLANTRA) 1 % CREA, Apply topically once daily., Disp: , Rfl:  .  loratadine (CLARITIN REDITABS) 10 MG dissolvable tablet, Take 1 tablet by mouth daily., Disp: , Rfl:  .  losartan (COZAAR) 25 MG tablet, Take 1 tablet by mouth daily., Disp: , Rfl:  .  MULTIPLE VITAMIN PO, Take 1 tablet by mouth daily., Disp: , Rfl:  .  nitroGLYCERIN (NITROSTAT) 0.4 MG SL tablet, Place 1 tablet under the tongue as needed. Reported on 06/05/2015, Disp: , Rfl:  .  simvastatin (ZOCOR) 40 MG tablet, TAKE ONE-HALF TABLET DAILY, Disp: 30 tablet, Rfl: 5 .  tetrahydrozoline-zinc (EYE DROPS RELIEF) 0.05-0.25 % ophthalmic solution, Apply 1 drop to eye daily.,  Disp: , Rfl:  .  albuterol (PROVENTIL HFA;VENTOLIN HFA) 108 (90 Base) MCG/ACT inhaler, Inhale 1-2 puffs every 4 (four) hours as needed into the lungs for wheezing or shortness of breath. (Patient not taking: Reported on 01/28/2017), Disp: 1 Inhaler, Rfl: 0 .  azithromycin (ZITHROMAX) 250 MG tablet, Take 2 tablets PO on day one, and one tablet PO daily thereafter until completed. (Patient not taking: Reported on 01/28/2017), Disp: 6 tablet, Rfl: 0 .  Magnesium Citrate 100 MG TABS, Take 1 tablet by mouth daily. Reported on 06/05/2015, Disp: , Rfl:  .  pyridoxine (B6 NATURAL) 100 MG tablet, Take 1 tablet by mouth daily., Disp: , Rfl:   Review of Systems  Constitutional: Negative.   Respiratory: Negative.   Cardiovascular: Positive for palpitations (some "situational"). Negative for chest pain and leg swelling.  Gastrointestinal: Negative.   Skin: Negative.   Neurological: Negative.   Psychiatric/Behavioral: Negative for sleep disturbance. The patient is nervous/anxious.     Social History   Tobacco Use  . Smoking status: Former Smoker    Years: 1.00  . Smokeless tobacco: Never Used  Substance Use Topics  . Alcohol use: Yes    Alcohol/week: 0.0 oz    Comment: OCCASIONALLY   Objective:   BP 122/70 (BP Location: Left Arm, Patient Position: Sitting, Cuff Size: Normal)   Pulse 80  Temp 98.9 F (37.2 C) (Oral)   Resp 16   Wt 149 lb 3.2 oz (67.7 kg)   SpO2 99%   BMI 28.66 kg/m    Physical Exam  Constitutional: She appears well-developed and well-nourished. No distress.  Neck: Normal range of motion. Neck supple. No tracheal deviation present. No thyromegaly present.  Cardiovascular: Normal rate, regular rhythm and normal heart sounds. Exam reveals no gallop and no friction rub.  No murmur heard. Pulmonary/Chest: Effort normal and breath sounds normal. No respiratory distress. She has no wheezes. She has no rales.  Lymphadenopathy:    She has no cervical adenopathy.  Skin: Skin is  warm and dry. She is not diaphoretic.  No wounds  Psychiatric: She has a normal mood and affect. Her behavior is normal. Judgment and thought content normal.  Vitals reviewed.       Assessment & Plan:     1. Exposure to MRSA Discussed with patient that we would not treat unless she developed wounds or issues thus may not be needed to test since she was asymptomatic. Patient stated "well since I am here we should just check" thus nasal swab was collected for MRSA culture as below. I will f/u pending results.  - MRSA culture       Mar Daring, PA-C  [facility]

## 2017-01-28 NOTE — Patient Instructions (Signed)
Preventing MDRO Infections Multidrug-resistant organisms (MDRO) are bacteria that have become resistant to antibiotic medicines. This means that antibiotics cannot stop the bacteria from growing. Types of MDRO include:  Methicillin/oxacillin-resistant Staphylococcus aureus (MRSA).  Vancomycin-resistant enterococci (VRE).  Extended-spectrum beta-lactamases (ESBLs).  Clostridium difficile (C. Difficile).  Multi-drug resistant tuberculosis (MDR TB).  Penicillin-resistant Streptococcus pneumonia (PRSP).  Carbapenem-resistant enterobacteriaceae (CRE).  Everyone has good and bad bacteria in his or her body, such as in the stomach or on the skin. Good bacteria help protect the body from infection. However, when you take an antibiotic medicine, it may kill both the good and bad bacteria, which then allows medicine-resistant bacteria to grow. Infections caused by MDRO can be difficult to treat. It is important to follow certain safety measures to prevent the spread of MDRO. What increases the risk? You are more likely to develop a MDRO infection if:  You were treated with an antibiotic medicine for a long time.  You have been in the hospital for a long time.  You recently had major surgery, such as chest or abdominal surgery.  You have a weakened disease-fighting (immune) system. This may be caused by an illness, long-term (chronic) condition, or medical treatment.  You have a catheter that has stayed in for a long time, such as a urinary catheter or vascular access device.  MDRO are usually spread through hands that have the germs (contaminated hands). MDRO may also be spread through:  Medical equipment that was not cleaned properly.  Shared personal items, such as razors or towels.  Contaminated surfaces, such as a bathroom counter or sink.  Undercooked or raw meat. Animals treated with antibiotics may have medicine-resistant bacteria.  Water or vegetables contaminated with animal  feces.  How is this treated? MDRO infections are usually treated with antibiotic medicines that are taken by mouth (oral antibiotics). Treatment depends on the type of MDRO you have. MDRO infections are difficult to treat, and you may need to be hospitalized. Depending on how severe your infection is, you may need other treatments such as:  IV antibiotics.  High-dose antibiotics.  More than one antibiotic.  Antibiotics that you breathe in (inhaled antibiotics), if you have pneumonia.  A machine to help you breathe (ventilator).  What actions can be taken? What hospitals are doing:  Encouraging staff, patients, and visitors to wash hands often with soap and warm water, and to use hand sanitizer when soap and water are not available.  Taking extra steps to prevent infection (contact precautions) with patients who are infected with MDRO. Contact precautions include: ? Having all healthcare workers and visitors wash their hands before and after leaving the room. ? Having all healthcare workers and visitors wear a gown and gloves while in the room, and asking them throw away the gown and gloves before leaving the room.  Prescribing antibiotic medicines only when they are needed. Over-prescribing antibiotic medicines can help the spread of MDRO.  Keeping patients with MDRO in a room by themselves (isolation) or placing them in rooms with other patients who are already infected with MDRO.  Carefully cleaning and disinfecting hospital rooms and equipment.  Improving communication about which patients are infected with MDRO or who have been infected in the past.  Closely monitoring and tracking MDRO infections.  Educating staff and patients about the signs of infection. What you can do:  Wash your hands regularly with soap and warm water. If soap and water are not available, use hand sanitizer.  Take antibiotic medicines  only when needed. Do not take antibiotic medicines for viral  infections such as the common cold.  If you were prescribed an antibiotic medicine, take it only as told by your health care provider. Do not stop taking the antibiotic even if you start to feel better.  Do not share antibiotic medicines with others.  Do not share personal items, such as bath towels or razors.  If you have a catheter, care for it as told by your health care provider.  Keep all wounds clean and dry. Follow your health care provider's instructions about how to care for any wounds you have.  Practice safe food handling. This includes: ? Washing all fruits and vegetables. ? Washing all utensils that have come in contact with raw meat. ? Keeping a separate cutting board for raw meat. ? Cooking meat thoroughly. All poultry (including chicken and [place]) should be cooked to at least 165F (74C). Ground beef, pork, or lamb should be cooked to at least 160F (71C), and whole beef, pork, or lamb should be cooked to at least 145F (63C). ? Washing your hands with soap and warm water before and after cooking, especially after handling raw meat.  Clean and disinfect surfaces that are touched often. Use solutions or products that contain bleach. Do this on a regular basis. What visitors can do:  Although it is rare, visitors can be infected with MDRO. To prevent this, visitors should:  Wash their hands with soap and warm water before and after visiting you. If soap and water are not available, they can use hand sanitizer.  Ask your health care provider if they need to wear gloves and gowns when they visit you. If they do need to wear these, make sure they throw away the gloves and gowns before they leave your room.  Where to find more information: You can find more information about preventing MDRO infections from:  Centers for Disease Control and Prevention: eBuzzed.gl  Summary  MDRO are bacteria that have become resistant to antibiotic  medicines.  You are more likely to be infected with a MDRO if you have been taking an antibiotic medicine for a long time or have been hospitalized for a long time.  If you were prescribed an antibiotic medicine, take it exactly as told by your health care provider.  Wash your hands regularly with soap and warm water. If soap and water are not available, use hand sanitizer.  Ask your health care provider whether your visitors need to wear gloves and gowns when visiting you. This information is not intended to replace advice given to you by your health care provider. Make sure you discuss any questions you have with your health care provider. Document Released: 06/13/2016 Document Revised: 06/13/2016 Document Reviewed: 06/13/2016 Elsevier Interactive Patient Education  2018 [employer].

## 2017-01-30 LAB — MRSA CULTURE
MICRO NUMBER: 81433307
SPECIMEN QUALITY: ADEQUATE

## 2017-03-16 ENCOUNTER — Ambulatory Visit: Payer: 59 | Admitting: Physician Assistant

## 2017-03-16 ENCOUNTER — Encounter: Payer: Self-pay | Admitting: Physician Assistant

## 2017-03-16 VITALS — BP 110/60 | HR 73 | Temp 98.7°F | Resp 16 | Wt 153.2 lb

## 2017-03-16 DIAGNOSIS — M545 Low back pain, unspecified: Secondary | ICD-10-CM

## 2017-03-16 DIAGNOSIS — M25552 Pain in left hip: Secondary | ICD-10-CM

## 2017-03-16 DIAGNOSIS — L603 Nail dystrophy: Secondary | ICD-10-CM

## 2017-03-16 DIAGNOSIS — L659 Nonscarring hair loss, unspecified: Secondary | ICD-10-CM

## 2017-03-16 NOTE — Progress Notes (Signed)
Patient: Katherine White Female    DOB: [birth year]   62 y.o.   MRN: [id number] Visit Date: 03/16/2017  Today's Provider: Mar Daring, PA-C   No chief complaint on file.  Subjective:    HPI Patient coming in today with c/o hair loss and brittle nails that have been worsening since December. She is already taking a hair and nails supplement OTC and has been taking since December. 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 She also reports some increased stress with her and her husband discussing downsizing again and remodeling her father-in-law's house to move into in the future.    She also reports having some left sided low back pain radiating around the left hip. She states she has been using tylenol and IBU alternating prn for pain. She has used some ice, but nothing recently. No heat. No numbness and tingling down left leg.     Allergies  Allergen Reactions  . Aspirin Other (See Comments)    Cannot tolerate large doses GI upset (High dosage) Can take 81 mg  . Codeine   . Codeine Sulfate Other (See Comments)     Current Outpatient Medications:  .  albuterol (PROVENTIL HFA;VENTOLIN HFA) 108 (90 Base) MCG/ACT inhaler, Inhale 1-2 puffs every 4 (four) hours as needed into the lungs for wheezing or shortness of breath. (Patient not taking: Reported on 01/28/2017), Disp: 1 Inhaler, Rfl: 0 .  aspirin 81 MG tablet, Take 1 tablet by mouth daily., Disp: , Rfl:  .  azithromycin (ZITHROMAX) 250 MG tablet, Take 2 tablets PO on day one, and one tablet PO daily thereafter until completed. (Patient not taking: Reported on 01/28/2017), Disp: 6 tablet, Rfl: 0 .  carvedilol (COREG) 3.125 MG tablet, Take 1 tablet by mouth daily., Disp: , Rfl:  .  Cholecalciferol (VITAMIN D) 2000 UNITS CAPS, Take 1 capsule by mouth daily., Disp: , Rfl:  .  fluticasone (FLONASE) 50 MCG/ACT nasal  spray, Place 2 sprays into both nostrils daily., Disp: 16 g, Rfl: 3 .  Ivermectin (SOOLANTRA) 1 % CREA, Apply topically once daily., Disp: , Rfl:  .  loratadine (CLARITIN REDITABS) 10 MG dissolvable tablet, Take 1 tablet by mouth daily., Disp: , Rfl:  .  losartan (COZAAR) 25 MG tablet, Take 1 tablet by mouth daily., Disp: , Rfl:  .  Magnesium Citrate 100 MG TABS, Take 1 tablet by mouth daily. Reported on 06/05/2015, Disp: , Rfl:  .  MULTIPLE VITAMIN PO, Take 1 tablet by mouth daily., Disp: , Rfl:  .  nitroGLYCERIN (NITROSTAT) 0.4 MG SL tablet, Place 1 tablet under the tongue as needed. Reported on 06/05/2015, Disp: , Rfl:  .  pyridoxine (B6 NATURAL) 100 MG tablet, Take 1 tablet by mouth daily., Disp: , Rfl:  .  simvastatin (ZOCOR) 40 MG tablet, TAKE ONE-HALF TABLET DAILY, Disp: 30 tablet, Rfl: 5 .  tetrahydrozoline-zinc (EYE DROPS RELIEF) 0.05-0.25 % ophthalmic solution, Apply 1 drop to eye daily., Disp: , Rfl:   Review of Systems  Constitutional: Negative for fatigue.  Respiratory: Negative.   Cardiovascular: Negative for chest pain, palpitations and leg swelling.  Gastrointestinal: Negative.   Musculoskeletal: Positive for arthralgias and back pain.  Skin:       Hair loss and brittle nail  Neurological: Negative.     Social History   Tobacco Use  . Smoking status: Former Smoker  Years: 1.00  . Smokeless tobacco: Never Used  Substance Use Topics  . Alcohol use: Yes    Alcohol/week: 0.0 oz    Comment: OCCASIONALLY   Objective:   BP 110/60 (BP Location: Left Arm, Patient Position: Sitting, Cuff Size: Normal)   Pulse 73   Temp 98.7 F (37.1 C) (Oral)   Resp 16   Wt 153 lb 3.2 oz (69.5 kg)   SpO2 99%   BMI 29.43 kg/m    Physical Exam  Constitutional: She appears well-developed and well-nourished. No distress.  Neck: Normal range of motion. Neck supple. No JVD present. No tracheal deviation present. No thyromegaly present.  Cardiovascular: Normal rate, regular rhythm and  normal heart sounds. Exam reveals no gallop and no friction rub.  No murmur heard. Pulmonary/Chest: Effort normal and breath sounds normal. No respiratory distress. She has no wheezes. She has no rales.  Musculoskeletal: Normal range of motion.       Left hip: She exhibits tenderness. She exhibits normal range of motion, normal strength and no bony tenderness.       Lumbar back: She exhibits tenderness. She exhibits normal range of motion.  Lymphadenopathy:    She has no cervical adenopathy.  Skin: She is not diaphoretic.  Vitals reviewed.      Assessment & Plan:     1. Hair loss Will check labs as below for organic cause. Patient request to have hormones checked. Continue supplement for now. I will f/u pending labs. Discussed possibility of stress shedding since she has had more stress recently and that if this is the cause you can shed for up to 6 months.  - T4 AND TSH - CBC w/Diff/Platelet - Fe+TIBC+Fer - B12 and Folate Panel - Vitamin D (25 hydroxy) - Alb+Prl+FSH+LH+Prog+DHEA+Es... - Testosterone  2. Brittle nails See above medical treatment plan. - T4 AND TSH - CBC w/Diff/Platelet - Fe+TIBC+Fer - B12 and Folate Panel - Vitamin D (25 hydroxy) - Alb+Prl+FSH+LH+Prog+DHEA+Es... - Testosterone  3. Pain of left hip joint Will try conservative management with stretching, moist heat, epsom salt soaks and tumeric prn for pain/inflammation.   4. Acute left-sided low back pain without sciatica See above medical treatment plan.       Mar Daring, PA-C  [facility]

## 2017-03-16 NOTE — Patient Instructions (Signed)

## 2017-03-19 ENCOUNTER — Telehealth: Payer: Self-pay

## 2017-03-19 LAB — CBC WITH DIFFERENTIAL/PLATELET
BASOS ABS: 0.1 10*3/uL (ref 0.0–0.2)
Basos: 1 %
EOS (ABSOLUTE): 0.4 10*3/uL (ref 0.0–0.4)
EOS: 6 %
HEMATOCRIT: 43.8 % (ref 34.0–46.6)
HEMOGLOBIN: 14.3 g/dL (ref 11.1–15.9)
IMMATURE GRANULOCYTES: 0 %
Immature Grans (Abs): 0 10*3/uL (ref 0.0–0.1)
LYMPHS: 28 %
Lymphocytes Absolute: 1.9 10*3/uL (ref 0.7–3.1)
MCH: 28.5 pg (ref 26.6–33.0)
MCHC: 32.6 g/dL (ref 31.5–35.7)
MCV: 87 fL (ref 79–97)
MONOCYTES: 7 %
Monocytes Absolute: 0.5 10*3/uL (ref 0.1–0.9)
NEUTROS PCT: 58 %
Neutrophils Absolute: 3.9 10*3/uL (ref 1.4–7.0)
Platelets: 218 10*3/uL (ref 150–379)
RBC: 5.01 x10E6/uL (ref 3.77–5.28)
RDW: 14.5 % (ref 12.3–15.4)
WBC: 6.7 10*3/uL (ref 3.4–10.8)

## 2017-03-19 LAB — ALB+PRL+FSH+LH+PROG+DHEA+ES...
ALBUMIN: 4.4 g/dL (ref 3.6–4.8)
DEHYDROEPIANDROSTERO: 99 ng/dL (ref 31–701)
ESTROGEN: 78 pg/mL
FSH: 63.3 m[IU]/mL
LH: 29.7 m[IU]/mL
Progesterone: <0.1 ng/mL
Prolactin: 4.5 ng/mL — ABNORMAL LOW (ref 4.8–23.3)
Sex Hormone Binding: 47.1 nmol/L (ref 17.3–125.0)
Vit D, 25-Hydroxy: 39.6 ng/mL (ref 30.0–100.0)

## 2017-03-19 LAB — TESTOSTERONE: TESTOSTERONE: 13 ng/dL (ref 3–41)

## 2017-03-19 LAB — B12 AND FOLATE PANEL
FOLATE: 17.1 ng/mL (ref >3.0)
VITAMIN B 12: 747 pg/mL (ref 232–1245)

## 2017-03-19 LAB — IRON,TIBC AND FERRITIN PANEL
FERRITIN: 135 ng/mL (ref 15–150)
IRON: 120 ug/dL (ref 27–159)
Iron Saturation: 42 % (ref 15–55)
TIBC: 288 ug/dL (ref 250–450)
UIBC: 168 ug/dL (ref 131–425)

## 2017-03-19 LAB — T4 AND TSH
T4, Total: 7.1 ug/dL (ref 4.5–12.0)
TSH: 2.13 u[IU]/mL (ref 0.450–4.500)

## 2017-03-19 NOTE — Telephone Encounter (Signed)
-----   Message from Mar Daring, [place] sent at 03/19/2017  1:29 PM EST ----- All labs are normal and hormone levels are normal. Prolactin is borderline low, but we never do anything for low prolactin, we only worry if it is ever elevated. No hormonal source noted for hair loss.

## 2017-03-19 NOTE — Telephone Encounter (Signed)
Patient advised as directed below.  Thanks,  -Warda Mcqueary 

## 2017-04-09 ENCOUNTER — Other Ambulatory Visit: Payer: Self-pay | Admitting: Physician Assistant

## 2017-04-09 DIAGNOSIS — J40 Bronchitis, not specified as acute or chronic: Secondary | ICD-10-CM

## 2017-04-09 MED ORDER — FLUTICASONE PROPIONATE 50 MCG/ACT NA SUSP: 2.0000 | Freq: Every day | NASAL | 3 refills | Status: DC

## 2017-04-09 NOTE — Telephone Encounter (Signed)
Pt contacted office for refill request on the following medications:  fluticasone (FLONASE) 50 MCG/ACT nasal spray  [facility]  Last Rx: 12/06/16 LOV: 03/16/17 Please advise. Thanks TNP

## 2017-06-11 ENCOUNTER — Encounter: Payer: Self-pay | Admitting: Physician Assistant

## 2017-06-11 ENCOUNTER — Ambulatory Visit (INDEPENDENT_AMBULATORY_CARE_PROVIDER_SITE_OTHER): Payer: 59 | Admitting: Physician Assistant

## 2017-06-11 ENCOUNTER — Other Ambulatory Visit: Payer: Self-pay

## 2017-06-11 VITALS — BP 98/80 | HR 81 | Ht 60.5 in | Wt 149.0 lb

## 2017-06-11 DIAGNOSIS — I5181 Takotsubo syndrome: Secondary | ICD-10-CM

## 2017-06-11 DIAGNOSIS — E7800 Pure hypercholesterolemia, unspecified: Secondary | ICD-10-CM

## 2017-06-11 DIAGNOSIS — Z1231 Encounter for screening mammogram for malignant neoplasm of breast: Secondary | ICD-10-CM

## 2017-06-11 DIAGNOSIS — R252 Cramp and spasm: Secondary | ICD-10-CM | POA: Diagnosis not present

## 2017-06-11 DIAGNOSIS — Z0000 Encounter for general adult medical examination without abnormal findings: Secondary | ICD-10-CM | POA: Diagnosis not present

## 2017-06-11 DIAGNOSIS — Z803 Family history of malignant neoplasm of breast: Secondary | ICD-10-CM | POA: Diagnosis not present

## 2017-06-11 DIAGNOSIS — E559 Vitamin D deficiency, unspecified: Secondary | ICD-10-CM

## 2017-06-11 DIAGNOSIS — Z789 Other specified health status: Secondary | ICD-10-CM | POA: Diagnosis not present

## 2017-06-11 DIAGNOSIS — Z1239 Encounter for other screening for malignant neoplasm of breast: Secondary | ICD-10-CM

## 2017-06-11 DIAGNOSIS — I10 Essential (primary) hypertension: Secondary | ICD-10-CM

## 2017-06-11 NOTE — Patient Instructions (Signed)
Health Maintenance for Postmenopausal Women Menopause is a normal process in which your reproductive ability comes to an end. This process happens gradually over a span of months to years, usually between the ages of 22 and 9. Menopause is complete when you have missed 12 consecutive menstrual periods. It is important to talk with your health care provider about some of the most common conditions that affect postmenopausal women, such as heart disease, cancer, and bone loss (osteoporosis). Adopting a healthy lifestyle and getting preventive care can help to promote your health and wellness. Those actions can also lower your chances of developing some of these common conditions. What should I know about menopause? During menopause, you may experience a number of symptoms, such as:  Moderate-to-severe hot flashes.  Night sweats.  Decrease in sex drive.  Mood swings.  Headaches.  Tiredness.  Irritability.  Memory problems.  Insomnia.  Choosing to treat or not to treat menopausal changes is an individual decision that you make with your health care provider. What should I know about hormone replacement therapy and supplements? Hormone therapy products are effective for treating symptoms that are associated with menopause, such as hot flashes and night sweats. Hormone replacement carries certain risks, especially as you become older. If you are thinking about using estrogen or estrogen with progestin treatments, discuss the benefits and risks with your health care provider. What should I know about heart disease and stroke? Heart disease, heart attack, and stroke become more likely as you age. This may be due, in part, to the hormonal changes that your body experiences during menopause. These can affect how your body processes dietary fats, triglycerides, and cholesterol. Heart attack and stroke are both medical emergencies. There are many things that you can do to help prevent heart disease  and stroke:  Have your blood pressure checked at least every 1-2 years. High blood pressure causes heart disease and increases the risk of stroke.  If you are 53-22 years old, ask your health care provider if you should take aspirin to prevent a heart attack or a stroke.  Do not use any tobacco products, including cigarettes, chewing tobacco, or electronic cigarettes. If you need help quitting, ask your health care provider.  It is important to eat a healthy diet and maintain a healthy weight. ? Be sure to include plenty of vegetables, fruits, low-fat dairy products, and lean protein. ? Avoid eating foods that are high in solid fats, added sugars, or salt (sodium).  Get regular exercise. This is one of the most important things that you can do for your health. ? Try to exercise for at least 150 minutes each week. The type of exercise that you do should increase your heart rate and make you sweat. This is known as moderate-intensity exercise. ? Try to do strengthening exercises at least twice each week. Do these in addition to the moderate-intensity exercise.  Know your numbers.Ask your health care provider to check your cholesterol and your blood glucose. Continue to have your blood tested as directed by your health care provider.  What should I know about cancer screening? There are several types of cancer. Take the following steps to reduce your risk and to catch any cancer development as early as possible. Breast Cancer  Practice breast self-awareness. ? This means understanding how your breasts normally appear and feel. ? It also means doing regular breast self-exams. Let your health care provider know about any changes, no matter how small.  If you are 40  or older, have a clinician do a breast exam (clinical breast exam or CBE) every year. Depending on your age, family history, and medical history, it may be recommended that you also have a yearly breast X-ray (mammogram).  If you  have a family history of breast cancer, talk with your health care provider about genetic screening.  If you are at high risk for breast cancer, talk with your health care provider about having an MRI and a mammogram every year.  Breast cancer (BRCA) gene test is recommended for women who have family members with BRCA-related cancers. Results of the assessment will determine the need for genetic counseling and BRCA1 and for BRCA2 testing. BRCA-related cancers include these types: ? Breast. This occurs in males or females. ? Ovarian. ? Tubal. This may also be called fallopian tube cancer. ? Cancer of the abdominal or pelvic lining (peritoneal cancer). ? Prostate. ? Pancreatic.  Cervical, Uterine, and Ovarian Cancer Your health care provider may recommend that you be screened regularly for cancer of the pelvic organs. These include your ovaries, uterus, and vagina. This screening involves a pelvic exam, which includes checking for microscopic changes to the surface of your cervix (Pap test).  For women ages 21-65, health care providers may recommend a pelvic exam and a Pap test every three years. For women ages 79-65, they may recommend the Pap test and pelvic exam, combined with testing for human papilloma virus (HPV), every five years. Some types of HPV increase your risk of cervical cancer. Testing for HPV may also be done on women of any age who have unclear Pap test results.  Other health care providers may not recommend any screening for nonpregnant women who are considered low risk for pelvic cancer and have no symptoms. Ask your health care provider if a screening pelvic exam is right for you.  If you have had past treatment for cervical cancer or a condition that could lead to cancer, you need Pap tests and screening for cancer for at least 20 years after your treatment. If Pap tests have been discontinued for you, your risk factors (such as having a new sexual partner) need to be  reassessed to determine if you should start having screenings again. Some women have medical problems that increase the chance of getting cervical cancer. In these cases, your health care provider may recommend that you have screening and Pap tests more often.  If you have a family history of uterine cancer or ovarian cancer, talk with your health care provider about genetic screening.  If you have vaginal bleeding after reaching menopause, tell your health care provider.  There are currently no reliable tests available to screen for ovarian cancer.  Lung Cancer Lung cancer screening is recommended for adults 69-62 years old who are at high risk for lung cancer because of a history of smoking. A yearly low-dose CT scan of the lungs is recommended if you:  Currently smoke.  Have a history of at least 30 pack-years of smoking and you currently smoke or have quit within the past 15 years. A pack-year is smoking an average of one pack of cigarettes per day for one year.  Yearly screening should:  Continue until it has been 15 years since you quit.  Stop if you develop a health problem that would prevent you from having lung cancer treatment.  Colorectal Cancer  This type of cancer can be detected and can often be prevented.  Routine colorectal cancer screening usually begins at  age 42 and continues through age 45.  If you have risk factors for colon cancer, your health care provider may recommend that you be screened at an earlier age.  If you have a family history of colorectal cancer, talk with your health care provider about genetic screening.  Your health care provider may also recommend using home test kits to check for hidden blood in your stool.  A small camera at the end of a tube can be used to examine your colon directly (sigmoidoscopy or colonoscopy). This is done to check for the earliest forms of colorectal cancer.  Direct examination of the colon should be repeated every  5-10 years until age 71. However, if early forms of precancerous polyps or small growths are found or if you have a family history or genetic risk for colorectal cancer, you may need to be screened more often.  Skin Cancer  Check your skin from head to toe regularly.  Monitor any moles. Be sure to tell your health care provider: ? About any new moles or changes in moles, especially if there is a change in a mole's shape or color. ? If you have a mole that is larger than the size of a pencil eraser.  If any of your family members has a history of skin cancer, especially at a young age, talk with your health care provider about genetic screening.  Always use sunscreen. Apply sunscreen liberally and repeatedly throughout the day.  Whenever you are outside, protect yourself by wearing long sleeves, pants, a wide-brimmed hat, and sunglasses.  What should I know about osteoporosis? Osteoporosis is a condition in which bone destruction happens more quickly than new bone creation. After menopause, you may be at an increased risk for osteoporosis. To help prevent osteoporosis or the bone fractures that can happen because of osteoporosis, the following is recommended:  If you are 46-71 years old, get at least 1,000 mg of calcium and at least 600 mg of vitamin D per day.  If you are older than age 55 but younger than age 65, get at least 1,200 mg of calcium and at least 600 mg of vitamin D per day.  If you are older than age 54, get at least 1,200 mg of calcium and at least 800 mg of vitamin D per day.  Smoking and excessive alcohol intake increase the risk of osteoporosis. Eat foods that are rich in calcium and vitamin D, and do weight-bearing exercises several times each week as directed by your health care provider. What should I know about how menopause affects my mental health? Depression may occur at any age, but it is more common as you become older. Common symptoms of depression  include:  Low or sad mood.  Changes in sleep patterns.  Changes in appetite or eating patterns.  Feeling an overall lack of motivation or enjoyment of activities that you previously enjoyed.  Frequent crying spells.  Talk with your health care provider if you think that you are experiencing depression. What should I know about immunizations? It is important that you get and maintain your immunizations. These include:  Tetanus, diphtheria, and pertussis (Tdap) booster vaccine.  Influenza every year before the flu season begins.  Pneumonia vaccine.  Shingles vaccine.  Your health care provider may also recommend other immunizations. This information is not intended to replace advice given to you by your health care provider. Make sure you discuss any questions you have with your health care provider. Document Released: 03/21/2005  Document Revised: 08/17/2015 Document Reviewed: 10/31/2014 Elsevier Interactive Patient Education  [employer].

## 2017-06-11 NOTE — Progress Notes (Signed)
Patient: Katherine White, Female    DOB: April 20, 1955, 62 y.o.   MRN: [id number] Visit Date: 06/11/2017  Today's Provider: Mar Daring, PA-C   Chief Complaint  Patient presents with  . Annual Exam   Subjective:    Annual physical exam Katherine White is a 62 y.o. female who presents today for health maintenance and complete physical. She feels fairly well. She reports exercising rarely. She reports she is sleeping fairly well. -----------------------------------------------------------------   Review of Systems  Constitutional: Negative.   HENT: Positive for sneezing, tinnitus and voice change.   Eyes: Negative.   Respiratory: Negative.   Cardiovascular: Negative.   Gastrointestinal: Negative.   Endocrine: Negative.   Genitourinary: Negative.   Musculoskeletal: Positive for arthralgias, gait problem and myalgias.  Skin: Negative.   Allergic/Immunologic: Positive for environmental allergies.  Hematological: Negative.   Psychiatric/Behavioral: Negative.     Social History      She  reports that she has quit smoking. Her smoking use included cigarettes. She quit after 1.00 year of use. She has never used smokeless tobacco. She reports that she drinks alcohol. She reports that she does not use drugs.       Social History   Socioeconomic History  . Marital status: Married    Spouse name: Belenda Cruise  . Number of children: 4  . Years of education: some college  . Highest education level: Not on file  Occupational History    Employer: RETIRED  Social Needs  . Financial resource strain: Not on file  . Food insecurity:    Worry: Not on file    Inability: Not on file  . Transportation needs:    Medical: Not on file    Non-medical: Not on file  Tobacco Use  . Smoking status: Former Smoker    Years: 1.00    Types: Cigarettes  . Smokeless tobacco: Never Used  Substance and Sexual Activity  . Alcohol use: Yes    Alcohol/week: 0.0 oz    Comment:  OCCASIONALLY  . Drug use: No  . Sexual activity: Yes  Lifestyle  . Physical activity:    Days per week: Not on file    Minutes per session: Not on file  . Stress: Not on file  Relationships  . Social connections:    Talks on phone: Not on file    Gets together: Not on file    Attends religious service: Not on file    Active member of club or organization: Not on file    Attends meetings of clubs or organizations: Not on file    Relationship status: Not on file  Other Topics Concern  . Not on file  Social History Narrative  . Not on file    Past Medical History:  Diagnosis Date  . Hypertension      Patient Active Problem List   Diagnosis Date Noted  . Family history of breast cancer in mother 06/05/2016  . Personal history of other diseases of the circulatory system 11/17/2014  . Atypical chest pain 11/17/2014  . Angina pectoris ([place]) 11/17/2014  . Takotsubo cardiomyopathy 11/17/2014  . Trouble in sleeping 11/17/2014  . Abortion, spontaneous 08/09/2014  . Arthritis 08/09/2014  . Arteriosclerosis of coronary artery 08/09/2014  . Alopecia 08/09/2014  . BP (high blood pressure) 08/09/2014  . Adiposity 08/09/2014  . Hypercholesterolemia without hypertriglyceridemia 08/09/2014  . Avitaminosis D 08/09/2014  . Myocardial infarction [facility]) 01/16/2010    Past Surgical History:  Procedure  Laterality Date  . CARDIAC CATHETERIZATION  2011  . COLONOSCOPY WITH PROPOFOL N/A 08/21/2016   Procedure: COLONOSCOPY WITH PROPOFOL;  Surgeon: Jonathon Bellows, MD;  Location: [facility] ENDOSCOPY;  Service: Endoscopy;  Laterality: N/A;  . DILATION AND CURETTAGE OF UTERUS  1980, 1981   AFTER 2 MISCARRIAGES  . LEEP  1999  . TONSILLECTOMY    . [place]    Family History        Family Status  Relation Name Status  . Brother 1 Alive  . Mat [employer]  . Annamarie Major  Deceased  . MGM  Deceased  . PGM  Deceased  . Mother  Deceased  . Father  Deceased  . MGF   Deceased  . Brother 2 Alive  . Other MGAunt (Not Specified)  . Cousin maternal Alive        Her family history includes Bladder Cancer in her father; Breast cancer in her cousin, maternal aunt, and other; Breast cancer (age of onset: 38) in her mother; Coronary artery disease in her mother; Diabetes in her maternal grandmother, other, and paternal uncle; Heart attack in her maternal grandfather; Heart disease in her father, mother, and other; Hyperlipidemia in her other; Hypertension in her brother; Liver cancer in her mother; Pneumonia in her paternal grandmother.      Allergies  Allergen Reactions  . Aspirin Other (See Comments)    Cannot tolerate large doses GI upset (High dosage) Can take 81 mg  . Codeine   . Codeine Sulfate Other (See Comments)     Current Outpatient Medications:  .  aspirin 81 MG tablet, Take 1 tablet by mouth daily., Disp: , Rfl:  .  carvedilol (COREG) 3.125 MG tablet, Take 1 tablet by mouth daily., Disp: , Rfl:  .  Cholecalciferol (VITAMIN D) 2000 UNITS CAPS, Take 1 capsule by mouth daily., Disp: , Rfl:  .  Ivermectin (SOOLANTRA) 1 % CREA, Apply topically once daily., Disp: , Rfl:  .  losartan (COZAAR) 25 MG tablet, Take 1 tablet by mouth daily., Disp: , Rfl:  .  Magnesium Citrate 100 MG TABS, Take 1 tablet by mouth daily. Reported on 06/05/2015, Disp: , Rfl:  .  MULTIPLE VITAMIN PO, Take 1 tablet by mouth daily., Disp: , Rfl:  .  nitroGLYCERIN (NITROSTAT) 0.4 MG SL tablet, Place 1 tablet under the tongue as needed. Reported on 06/05/2015, Disp: , Rfl:  .  pyridoxine (B6 NATURAL) 100 MG tablet, Take 1 tablet by mouth daily., Disp: , Rfl:  .  simvastatin (ZOCOR) 40 MG tablet, TAKE ONE-HALF TABLET DAILY, Disp: 30 tablet, Rfl: 5 .  tetrahydrozoline-zinc (EYE DROPS RELIEF) 0.05-0.25 % ophthalmic solution, Apply 1 drop to eye daily., Disp: , Rfl:  .  albuterol (PROVENTIL HFA;VENTOLIN HFA) 108 (90 Base) MCG/ACT inhaler, Inhale 1-2 puffs every 4 (four) hours as needed  into the lungs for wheezing or shortness of breath. (Patient not taking: Reported on 01/28/2017), Disp: 1 Inhaler, Rfl: 0 .  fluticasone (FLONASE) 50 MCG/ACT nasal spray, Place 2 sprays into both nostrils daily. (Patient not taking: Reported on 06/11/2017), Disp: 16 g, Rfl: 3 .  loratadine (CLARITIN REDITABS) 10 MG dissolvable tablet, Take 1 tablet by mouth daily., Disp: , Rfl:    Patient Care Team: Rubye Beach as PCP - General (Physician Assistant)      Objective:   Vitals: BP 98/80 (BP Location: Left Arm, Patient Position: Sitting, Cuff Size: Normal)   Pulse 81   Ht 5'  0.5" (1.537 m)   Wt 149 lb (67.6 kg)   SpO2 98%   BMI 28.62 kg/m    Vitals:   06/11/17 0902  BP: 98/80  Pulse: 81  SpO2: 98%  Weight: 149 lb (67.6 kg)  Height: 5' 0.5" (1.537 m)     Physical Exam  Constitutional: She is oriented to person, place, and time. She appears well-developed and well-nourished. No distress.  HENT:  Head: Normocephalic and atraumatic.  Right Ear: Hearing, tympanic membrane, external ear and ear canal normal.  Left Ear: Hearing, tympanic membrane, external ear and ear canal normal.  Nose: Nose normal.  Mouth/Throat: Uvula is midline, oropharynx is clear and moist and mucous membranes are normal. No oropharyngeal exudate.  Eyes: Pupils are equal, round, and reactive to light. Conjunctivae and EOM are normal. Right eye exhibits no discharge. Left eye exhibits no discharge. No scleral icterus.  Neck: Normal range of motion. Neck supple. No JVD present. Carotid bruit is not present. No tracheal deviation present. No thyromegaly present.  Cardiovascular: Normal rate, regular rhythm, normal heart sounds and intact distal pulses. Exam reveals no gallop and no friction rub.  No murmur heard. Pulmonary/Chest: Effort normal and breath sounds normal. No respiratory distress. She has no wheezes. She has no rales. She exhibits no tenderness.  Abdominal: Soft. Bowel sounds are normal. She  exhibits no distension and no mass. There is no tenderness. There is no rebound and no guarding.  Musculoskeletal: Normal range of motion. She exhibits no edema or tenderness.  Lymphadenopathy:    She has no cervical adenopathy.  Neurological: She is alert and oriented to person, place, and time.  Skin: Skin is warm and dry. No rash noted. She is not diaphoretic.  Psychiatric: She has a normal mood and affect. Her behavior is normal. Judgment and thought content normal.  Vitals reviewed.    Depression Screen PHQ 2/9 Scores 06/11/2017 06/05/2016  PHQ - 2 Score 0 0  PHQ- 9 Score - 2      Assessment & Plan:     Routine Health Maintenance and Physical Exam  Exercise Activities and Dietary recommendations Goals    None      Immunization History  Administered Date(s) Administered  . Influenza Split 10/23/2009, 11/21/2010  . Influenza,inj,Quad PF,6+ Mos 11/09/2013, 11/17/2014, 03/15/2016  . Influenza-Unspecified 12/03/2016  . Tdap 03/04/2006, 12/29/2013  . Zoster 04/10/2011    Health Maintenance  Topic Date Due  . MAMMOGRAM  08/07/2017  . INFLUENZA VACCINE  09/10/2017  . PAP SMEAR  05/18/2019  . COLONOSCOPY  08/21/2021  . TETANUS/TDAP  12/30/2023  . Hepatitis C Screening  Completed  . HIV Screening  Completed    Discussed health benefits of physical activity, and encouraged her to engage in regular exercise appropriate for her age and condition.    1. Annual physical exam Normal physical exam today. Will check labs as below and f/u pending lab results. If labs are stable and WNL she will not need to have these rechecked for one year at her next annual physical exam. She is to call the office in the meantime if she has any acute issue, questions or concerns. - CBC w/Diff/Platelet - Comprehensive Metabolic Panel (CMET) - TSH - Lipid Profile - HgB A1c  2. Breast cancer screening Family history in mother. Due in June. She does perform regular self breast exams.  Mammogram was ordered as below. Information for [facility] Breast clinic was given to patient so she may schedule her mammogram at her convenience. -  MM Digital Screening; Future  3. Essential hypertension Low today. Patient on losartan 30m and carvedilol 3.1266m Followed by Cardiology due to h/o takotsubo cardiomyopathy. Will check labs as below and f/u pending results. - CBC w/Diff/Platelet - Comprehensive Metabolic Panel (CMET) - TSH - Lipid Profile - HgB A1c - Magnesium  4. Takotsubo cardiomyopathy Stable. Followed by Cardiology. Will check labs as below and f/u pending results. - CBC w/Diff/Platelet - Comprehensive Metabolic Panel (CMET) - TSH - Lipid Profile - HgB A1c  5. Hypercholesterolemia without hypertriglyceridemia Stable. Continue simvastatin 4070mWill check labs as below and f/u pending results. - Comprehensive Metabolic Panel (CMET) - Lipid Profile - HgB A1c  6. Avitaminosis D H/O this. Postmenopausal. Not on supplementation. Will check labs as below and f/u pending results. - CBC w/Diff/Platelet - Vitamin D (25 hydroxy)  7. Family history of breast cancer in mother Due in June. - MM Digital Screening; Future  8. Muscle cramping New onset. Will check labs as below and f/u pending results. - Comprehensive Metabolic Panel (CMET) - TSH - Magnesium - Vitamin D (25 hydroxy)  9. History of measles, mumps, rubella (MMR) vaccination unknown Will check titers. Patient thinks she had measles and mumps. No known vaccination. Patient keeps her grandchildren and wants to make sure she is vaccinated. - Measles/Mumps/Rubella Immunity  --------------------------------------------------------------------    JenMar DaringA-C  [facility]

## 2017-06-12 LAB — CBC WITH DIFFERENTIAL/PLATELET
BASOS ABS: 0.1 10*3/uL (ref 0.0–0.2)
Basos: 1 %
EOS (ABSOLUTE): 0.5 10*3/uL — ABNORMAL HIGH (ref 0.0–0.4)
Eos: 9 %
Hematocrit: 45.3 % (ref 34.0–46.6)
Hemoglobin: 15.1 g/dL (ref 11.1–15.9)
Immature Grans (Abs): 0 10*3/uL (ref 0.0–0.1)
Immature Granulocytes: 0 %
Lymphocytes Absolute: 2.2 10*3/uL (ref 0.7–3.1)
Lymphs: 37 %
MCH: 29 pg (ref 26.6–33.0)
MCHC: 33.3 g/dL (ref 31.5–35.7)
MCV: 87 fL (ref 79–97)
MONOS ABS: 0.4 10*3/uL (ref 0.1–0.9)
Monocytes: 7 %
NEUTROS ABS: 2.8 10*3/uL (ref 1.4–7.0)
Neutrophils: 46 %
Platelets: 252 10*3/uL (ref 150–379)
RBC: 5.21 x10E6/uL (ref 3.77–5.28)
RDW: 14.5 % (ref 12.3–15.4)
WBC: 6.1 10*3/uL (ref 3.4–10.8)

## 2017-06-12 LAB — TSH: TSH: 1.55 u[IU]/mL (ref 0.450–4.500)

## 2017-06-12 LAB — COMPREHENSIVE METABOLIC PANEL
ALK PHOS: 104 IU/L (ref 39–117)
ALT: 38 IU/L — ABNORMAL HIGH (ref 0–32)
AST: 28 IU/L (ref 0–40)
Albumin/Globulin Ratio: 2.6 — ABNORMAL HIGH (ref 1.2–2.2)
Albumin: 4.6 g/dL (ref 3.6–4.8)
BILIRUBIN TOTAL: 0.3 mg/dL (ref 0.0–1.2)
BUN / CREAT RATIO: 15 (ref 12–28)
BUN: 11 mg/dL (ref 8–27)
CHLORIDE: 106 mmol/L (ref 96–106)
CO2: 22 mmol/L (ref 20–29)
Calcium: 9.8 mg/dL (ref 8.7–10.3)
Creatinine, Ser: 0.73 mg/dL (ref 0.57–1.00)
GFR calc Af Amer: 103 mL/min/{1.73_m2} (ref >59)
GFR calc non Af Amer: 89 mL/min/{1.73_m2} (ref >59)
GLOBULIN, TOTAL: 1.8 g/dL (ref 1.5–4.5)
GLUCOSE: 90 mg/dL (ref 65–99)
Potassium: 4.6 mmol/L (ref 3.5–5.2)
SODIUM: 141 mmol/L (ref 134–144)
Total Protein: 6.4 g/dL (ref 6.0–8.5)

## 2017-06-12 LAB — MAGNESIUM: Magnesium: 2.2 mg/dL (ref 1.6–2.3)

## 2017-06-12 LAB — MEASLES/MUMPS/RUBELLA IMMUNITY
MUMPS ABS, IGG: >300 AU/mL (ref >10.9)
RUBEOLA AB, IGG: 255 [AU]/ml (ref >29.9)
Rubella Antibodies, IGG: 14.2 index (ref >0.99)

## 2017-06-12 LAB — LIPID PANEL
CHOLESTEROL TOTAL: 178 mg/dL (ref 100–199)
Chol/HDL Ratio: 3.2 ratio (ref 0.0–4.4)
HDL: 55 mg/dL (ref >39)
LDL Calculated: 97 mg/dL (ref 0–99)
Triglycerides: 129 mg/dL (ref 0–149)
VLDL Cholesterol Cal: 26 mg/dL (ref 5–40)

## 2017-06-12 LAB — HEMOGLOBIN A1C
ESTIMATED AVERAGE GLUCOSE: 105 mg/dL
HEMOGLOBIN A1C: 5.3 % (ref 4.8–5.6)

## 2017-06-12 LAB — VITAMIN D 25 HYDROXY (VIT D DEFICIENCY, FRACTURES): VIT D 25 HYDROXY: 36.7 ng/mL (ref 30.0–100.0)

## 2017-08-11 ENCOUNTER — Ambulatory Visit
Admission: RE | Admit: 2017-08-11 | Discharge: 2017-08-11 | Disposition: A | Discharge status: Home / Self Care | Payer: 59 | Source: Ambulatory Visit | Attending: Physician Assistant | Admitting: Physician Assistant

## 2017-08-11 DIAGNOSIS — Z1231 Encounter for screening mammogram for malignant neoplasm of breast: Secondary | ICD-10-CM | POA: Insufficient documentation

## 2017-08-11 DIAGNOSIS — Z803 Family history of malignant neoplasm of breast: Secondary | ICD-10-CM | POA: Diagnosis not present

## 2017-08-11 DIAGNOSIS — Z1239 Encounter for other screening for malignant neoplasm of breast: Secondary | ICD-10-CM

## 2017-08-12 ENCOUNTER — Telehealth: Payer: Self-pay

## 2017-08-12 NOTE — Telephone Encounter (Signed)
Patient advised as below.  

## 2017-08-12 NOTE — Telephone Encounter (Signed)
Pt returned call and request call back on her cell#. Please advise. Thanks TNP

## 2017-08-12 NOTE — Telephone Encounter (Signed)
[email or web address]

## 2017-08-12 NOTE — Telephone Encounter (Signed)
-----   Message from Mar Daring, PA-C sent at 08/12/2017  8:45 AM EDT ----- Normal mammogram. Repeat screening in one year.

## 2017-10-15 DIAGNOSIS — E7849 Other hyperlipidemia: Secondary | ICD-10-CM | POA: Diagnosis not present

## 2017-10-15 DIAGNOSIS — I10 Essential (primary) hypertension: Secondary | ICD-10-CM | POA: Diagnosis not present

## 2017-10-15 DIAGNOSIS — I2510 Atherosclerotic heart disease of native coronary artery without angina pectoris: Secondary | ICD-10-CM | POA: Diagnosis not present

## 2017-11-12 DIAGNOSIS — L718 Other rosacea: Secondary | ICD-10-CM | POA: Diagnosis not present

## 2017-11-12 DIAGNOSIS — L281 Prurigo nodularis: Secondary | ICD-10-CM | POA: Diagnosis not present

## 2017-11-12 DIAGNOSIS — L820 Inflamed seborrheic keratosis: Secondary | ICD-10-CM | POA: Diagnosis not present

## 2017-11-12 DIAGNOSIS — L298 Other pruritus: Secondary | ICD-10-CM | POA: Diagnosis not present

## 2017-11-12 DIAGNOSIS — L821 Other seborrheic keratosis: Secondary | ICD-10-CM | POA: Diagnosis not present

## 2018-01-16 DIAGNOSIS — Z23 Encounter for immunization: Secondary | ICD-10-CM | POA: Diagnosis not present

## 2018-02-06 ENCOUNTER — Encounter: Payer: Self-pay | Admitting: Physician Assistant

## 2018-06-15 NOTE — Progress Notes (Signed)
Patient: Katherine White, Female    DOB: 16-May-1955, 63 y.o.   MRN: [id number] Visit Date: 06/16/2018  Today's Provider: Mar Daring, PA-C   Chief Complaint  Patient presents with  . Annual Exam   Subjective:     Annual physical exam Katherine White is a 63 y.o. female who presents today for health maintenance and complete physical. She feels well. She reports exercising some walking. She reports she is sleeping poorly, reports that she is not able to take Melatonin because it gives her a headache. -----------------------------------------------------------------   Review of Systems  Constitutional: Negative.   HENT: Positive for rhinorrhea, sinus pressure, sneezing and tinnitus.   Eyes: Positive for redness.  Respiratory: Negative.   Cardiovascular: Negative.   Gastrointestinal: Negative.   Endocrine: Negative.   Genitourinary: Negative.   Musculoskeletal: Positive for arthralgias and gait problem.  Skin: Negative.   Allergic/Immunologic: Positive for environmental allergies.  Neurological: Positive for light-headedness and headaches.  Hematological: Negative.   Psychiatric/Behavioral: Positive for sleep disturbance.    Social History      She  reports that she has quit smoking. Her smoking use included cigarettes. She quit after 1.00 year of use. She has never used smokeless tobacco. She reports current alcohol use. She reports that she does not use drugs.       Social History   Socioeconomic History  . Marital status: Married    Spouse name: Belenda Cruise  . Number of children: 4  . Years of education: some college  . Highest education level: Not on file  Occupational History    Employer: RETIRED  Social Needs  . Financial resource strain: Not on file  . Food insecurity:    Worry: Not on file    Inability: Not on file  . Transportation needs:    Medical: Not on file    Non-medical: Not on file  Tobacco Use  . Smoking status: Former  Smoker    Years: 1.00    Types: Cigarettes  . Smokeless tobacco: Never Used  Substance and Sexual Activity  . Alcohol use: Yes    Alcohol/week: 0.0 standard drinks    Comment: OCCASIONALLY  . Drug use: No  . Sexual activity: Yes  Lifestyle  . Physical activity:    Days per week: Not on file    Minutes per session: Not on file  . Stress: Not on file  Relationships  . Social connections:    Talks on phone: Not on file    Gets together: Not on file    Attends religious service: Not on file    Active member of club or organization: Not on file    Attends meetings of clubs or organizations: Not on file    Relationship status: Not on file  Other Topics Concern  . Not on file  Social History Narrative  . Not on file    Past Medical History:  Diagnosis Date  . Hypertension      Patient Active Problem List   Diagnosis Date Noted  . Family history of breast cancer in mother 06/05/2016  . Personal history of other diseases of the circulatory system 11/17/2014  . Atypical chest pain 11/17/2014  . Angina pectoris ([place]) 11/17/2014  . Takotsubo cardiomyopathy 11/17/2014  . Trouble in sleeping 11/17/2014  . Abortion, spontaneous 08/09/2014  . Arthritis 08/09/2014  . Arteriosclerosis of coronary artery 08/09/2014  . Alopecia 08/09/2014  . BP (high blood pressure) 08/09/2014  . Adiposity 08/09/2014  .  Hypercholesterolemia without hypertriglyceridemia 08/09/2014  . Avitaminosis D 08/09/2014  . Myocardial infarction ([place]) 01/16/2010    Past Surgical History:  Procedure Laterality Date  . CARDIAC CATHETERIZATION  2011  . COLONOSCOPY WITH PROPOFOL N/A 08/21/2016   Procedure: COLONOSCOPY WITH PROPOFOL;  Surgeon: Jonathon Bellows, MD;  Location: [facility] ENDOSCOPY;  Service: Endoscopy;  Laterality: N/A;  . DILATION AND CURETTAGE OF UTERUS  1980, 1981   AFTER 2 MISCARRIAGES  . LEEP  1999  . TONSILLECTOMY    . [place]    Family History        Family  Status  Relation Name Status  . Brother 1 Alive  . Mat [employer]  . Annamarie Major  Deceased  . MGM  Deceased  . PGM  Deceased  . Mother  Deceased  . Father  Deceased  . MGF  Deceased  . Brother 2 Alive  . Other MGAunt (Not Specified)  . Cousin maternal Alive        Her family history includes Bladder Cancer in her father; Breast cancer in her cousin, maternal aunt, and another family member; Breast cancer (age of onset: 56) in her mother; Coronary artery disease in her mother; Diabetes in her maternal grandmother, paternal uncle, and another family member; Heart attack in her maternal grandfather; Heart disease in her father, mother, and another family member; Hyperlipidemia in an other family member; Hypertension in her brother; Liver cancer in her mother; Pneumonia in her paternal grandmother.      Allergies  Allergen Reactions  . Aspirin Other (See Comments)    Cannot tolerate large doses GI upset (High dosage) Can take 81 mg  . Codeine   . Codeine Sulfate Other (See Comments)     Current Outpatient Medications:  .  aspirin 81 MG tablet, Take 1 tablet by mouth daily., Disp: , Rfl:  .  carvedilol (COREG) 3.125 MG tablet, Take 1 tablet by mouth daily., Disp: , Rfl:  .  Cholecalciferol (VITAMIN D) 2000 UNITS CAPS, Take 1 capsule by mouth daily., Disp: , Rfl:  .  fluticasone (FLONASE) 50 MCG/ACT nasal spray, Place 2 sprays into both nostrils daily., Disp: 16 g, Rfl: 3 .  Ivermectin (SOOLANTRA) 1 % CREA, Apply topically once daily., Disp: , Rfl:  .  losartan (COZAAR) 25 MG tablet, Take 1 tablet by mouth daily., Disp: , Rfl:  .  Magnesium Citrate 100 MG TABS, Take 1 tablet by mouth daily. Reported on 06/05/2015, Disp: , Rfl:  .  MULTIPLE VITAMIN PO, Take 1 tablet by mouth daily., Disp: , Rfl:  .  nitroGLYCERIN (NITROSTAT) 0.4 MG SL tablet, Place 1 tablet under the tongue as needed. Reported on 06/05/2015, Disp: , Rfl:  .  pyridoxine (B6 NATURAL) 100 MG tablet, Take 1 tablet by mouth  daily., Disp: , Rfl:  .  simvastatin (ZOCOR) 40 MG tablet, TAKE ONE-HALF TABLET DAILY, Disp: 30 tablet, Rfl: 5 .  tetrahydrozoline-zinc (EYE DROPS RELIEF) 0.05-0.25 % ophthalmic solution, Apply 1 drop to eye daily., Disp: , Rfl:  .  albuterol (PROVENTIL HFA;VENTOLIN HFA) 108 (90 Base) MCG/ACT inhaler, Inhale 1-2 puffs every 4 (four) hours as needed into the lungs for wheezing or shortness of breath. (Patient not taking: Reported on 01/28/2017), Disp: 1 Inhaler, Rfl: 0 .  loratadine (CLARITIN REDITABS) 10 MG dissolvable tablet, Take 1 tablet by mouth daily., Disp: , Rfl:    Patient Care Team: Rubye Beach as PCP - General (Physician Assistant)    Objective:  Vitals: BP 113/74 (BP Location: Left Arm, Patient Position: Sitting, Cuff Size: Large)   Pulse 80   Temp 98.8 F (37.1 C) (Oral)   Resp 16   Ht 5' (1.524 m)   Wt 155 lb 11.2 oz (70.6 kg)   BMI 30.41 kg/m    Vitals:   06/16/18 0913  BP: 113/74  Pulse: 80  Resp: 16  Temp: 98.8 F (37.1 C)  TempSrc: Oral  Weight: 155 lb 11.2 oz (70.6 kg)  Height: 5' (1.524 m)     Physical Exam Vitals signs reviewed.  Constitutional:      General: She is not in acute distress.    Appearance: Normal appearance. She is well-developed and normal weight. She is not ill-appearing or diaphoretic.  HENT:     Head: Normocephalic and atraumatic.     Right Ear: Hearing, tympanic membrane, ear canal and external ear normal.     Left Ear: Hearing, tympanic membrane, ear canal and external ear normal.     Nose: Nose normal.     Mouth/Throat:     Mouth: Mucous membranes are moist.     Pharynx: Oropharynx is clear. Uvula midline. No oropharyngeal exudate.  Eyes:     General: No scleral icterus.       Right eye: No discharge.        Left eye: No discharge.     Extraocular Movements: Extraocular movements intact.     Conjunctiva/sclera: Conjunctivae normal.     Pupils: Pupils are equal, round, and reactive to light.  Neck:      Musculoskeletal: Normal range of motion and neck supple.     Thyroid: No thyromegaly.     Vascular: No carotid bruit or JVD.     Trachea: No tracheal deviation.  Cardiovascular:     Rate and Rhythm: Normal rate and regular rhythm.     Pulses: Normal pulses.     Heart sounds: Normal heart sounds. No murmur. No friction rub. No gallop.   Pulmonary:     Effort: Pulmonary effort is normal. No respiratory distress.     Breath sounds: Normal breath sounds. No wheezing or rales.  Chest:     Chest wall: No tenderness.     Breasts: Breasts are symmetrical.        Right: No inverted nipple, mass, nipple discharge, skin change or tenderness.        Left: No inverted nipple, mass, nipple discharge, skin change or tenderness.  Abdominal:     General: Bowel sounds are normal. There is no distension.     Palpations: Abdomen is soft. There is no mass.     Tenderness: There is no abdominal tenderness. There is no guarding or rebound.     Hernia: There is no hernia in the right inguinal area or left inguinal area.  Genitourinary:    Exam position: Supine.     Labia:        Right: No rash, tenderness, lesion or injury.        Left: No rash, tenderness, lesion or injury.      Vagina: Normal. No signs of injury. No vaginal discharge, erythema, tenderness or bleeding.     Cervix: No cervical motion tenderness, discharge or friability.     Adnexa:        Right: No mass, tenderness or fullness.         Left: No mass, tenderness or fullness.       Rectum: Normal.  Musculoskeletal: Normal range of motion.  General: No tenderness.  Lymphadenopathy:     Cervical: No cervical adenopathy.  Skin:    General: Skin is warm and dry.     Findings: No rash.  Neurological:     Mental Status: She is alert and oriented to person, place, and time.     Cranial Nerves: No cranial nerve deficit.     Coordination: Coordination normal.     Deep Tendon Reflexes: Reflexes are normal and symmetric.  Psychiatric:         Mood and Affect: Mood normal.        Behavior: Behavior normal.        Thought Content: Thought content normal.        Judgment: Judgment normal.      Depression Screen PHQ 2/9 Scores 06/16/2018 06/11/2017 06/05/2016  PHQ - 2 Score 0 0 0  PHQ- 9 Score - - 2       Assessment & Plan:     Routine Health Maintenance and Physical Exam  Exercise Activities and Dietary recommendations Goals   None     Immunization History  Administered Date(s) Administered  . Influenza Split 10/23/2009, 11/21/2010  . Influenza,inj,Quad PF,6+ Mos 11/09/2013, 11/17/2014, 03/15/2016  . Influenza-Unspecified 12/03/2016  . Tdap 03/04/2006, 12/29/2013  . Zoster 04/10/2011  . Zoster Recombinat (Shingrix) 10/06/2017    Health Maintenance  Topic Date Due  . MAMMOGRAM  08/12/2018  . INFLUENZA VACCINE  09/11/2018  . PAP SMEAR-Modifier  05/18/2019  . COLONOSCOPY  08/21/2021  . TETANUS/TDAP  12/30/2023  . Hepatitis C Screening  Completed  . HIV Screening  Completed     Discussed health benefits of physical activity, and encouraged her to engage in regular exercise appropriate for her age and condition.    1. Annual physical exam Normal physical exam today. Will check labs as below and f/u pending lab results. If labs are stable and WNL she will not need to have these rechecked for one year at her next annual physical exam. She is to call the office in the meantime if she has any acute issue, questions or concerns. - CBC with Differential/Platelet - Comprehensive metabolic panel - Hemoglobin A1c - TSH  2. Breast cancer screening Breast exam today was normal. There is family history of breast cancer in mother. She does perform regular self breast exams. Mammogram was ordered as below. Information for [facility] Breast clinic was given to patient so she may schedule her mammogram at her convenience. - MM 3D SCREEN BREAST BILATERAL; Future  3. Essential hypertension Stable. Continue current  medical treatment plan of carvedilol 3.125mg , losartan 25mg . Will check labs as below and f/u pending results. - Comprehensive metabolic panel - Hemoglobin A1c  4. Hypercholesterolemia without hypertriglyceridemia Continue simvastatin 40mg  (takes 1/2 tab). Will check labs as below and f/u pending results. - Comprehensive metabolic panel - Hemoglobin A1c - Lipid panel  5. Family history of breast cancer in mother See above medical treatment plan #2.  - MM 3D SCREEN BREAST BILATERAL; Future  6. Cervical cancer screening Pap collected today. Will send as below and f/u pending results. - Cytology - PAP  7. Screening for thyroid disorder Will check labs as below and f/u pending results. - TSH  --------------------------------------------------------------------    Mar Daring, PA-C  [facility]

## 2018-06-16 ENCOUNTER — Other Ambulatory Visit (HOSPITAL_COMMUNITY)
Admission: RE | Admit: 2018-06-16 | Discharge: 2018-06-16 | Disposition: A | Discharge status: Home / Self Care | Payer: 59 | Source: Ambulatory Visit | Attending: Physician Assistant | Admitting: Physician Assistant

## 2018-06-16 ENCOUNTER — Other Ambulatory Visit: Payer: Self-pay

## 2018-06-16 ENCOUNTER — Ambulatory Visit (INDEPENDENT_AMBULATORY_CARE_PROVIDER_SITE_OTHER): Payer: 59 | Admitting: Physician Assistant

## 2018-06-16 ENCOUNTER — Encounter: Payer: Self-pay | Admitting: Physician Assistant

## 2018-06-16 VITALS — BP 113/74 | HR 80 | Temp 98.8°F | Resp 16 | Ht 60.0 in | Wt 155.7 lb

## 2018-06-16 DIAGNOSIS — Z124 Encounter for screening for malignant neoplasm of cervix: Secondary | ICD-10-CM | POA: Insufficient documentation

## 2018-06-16 DIAGNOSIS — Z1329 Encounter for screening for other suspected endocrine disorder: Secondary | ICD-10-CM

## 2018-06-16 DIAGNOSIS — Z1239 Encounter for other screening for malignant neoplasm of breast: Secondary | ICD-10-CM

## 2018-06-16 DIAGNOSIS — Z0000 Encounter for general adult medical examination without abnormal findings: Secondary | ICD-10-CM

## 2018-06-16 DIAGNOSIS — I10 Essential (primary) hypertension: Secondary | ICD-10-CM | POA: Diagnosis not present

## 2018-06-16 DIAGNOSIS — E7800 Pure hypercholesterolemia, unspecified: Secondary | ICD-10-CM

## 2018-06-16 DIAGNOSIS — Z803 Family history of malignant neoplasm of breast: Secondary | ICD-10-CM

## 2018-06-16 NOTE — Patient Instructions (Signed)
Health Maintenance for Postmenopausal Women Menopause is a normal process in which your reproductive ability comes to an end. This process happens gradually over a span of months to years, usually between the ages of 62 and 89. Menopause is complete when you have missed 12 consecutive menstrual periods. It is important to talk with your health care provider about some of the most common conditions that affect postmenopausal women, such as heart disease, cancer, and bone loss (osteoporosis). Adopting a healthy lifestyle and getting preventive care can help to promote your health and wellness. Those actions can also lower your chances of developing some of these common conditions. What should I know about menopause? During menopause, you may experience a number of symptoms, such as:  Moderate-to-severe hot flashes.  Night sweats.  Decrease in sex drive.  Mood swings.  Headaches.  Tiredness.  Irritability.  Memory problems.  Insomnia. Choosing to treat or not to treat menopausal changes is an individual decision that you make with your health care provider. What should I know about hormone replacement therapy and supplements? Hormone therapy products are effective for treating symptoms that are associated with menopause, such as hot flashes and night sweats. Hormone replacement carries certain risks, especially as you become older. If you are thinking about using estrogen or estrogen with progestin treatments, discuss the benefits and risks with your health care provider. What should I know about heart disease and stroke? Heart disease, heart attack, and stroke become more likely as you age. This may be due, in part, to the hormonal changes that your body experiences during menopause. These can affect how your body processes dietary fats, triglycerides, and cholesterol. Heart attack and stroke are both medical emergencies. There are many things that you can do to help prevent heart disease  and stroke:  Have your blood pressure checked at least every 1-2 years. High blood pressure causes heart disease and increases the risk of stroke.  If you are 79-72 years old, ask your health care provider if you should take aspirin to prevent a heart attack or a stroke.  Do not use any tobacco products, including cigarettes, chewing tobacco, or electronic cigarettes. If you need help quitting, ask your health care provider.  It is important to eat a healthy diet and maintain a healthy weight. ? Be sure to include plenty of vegetables, fruits, low-fat dairy products, and lean protein. ? Avoid eating foods that are high in solid fats, added sugars, or salt (sodium).  Get regular exercise. This is one of the most important things that you can do for your health. ? Try to exercise for at least 150 minutes each week. The type of exercise that you do should increase your heart rate and make you sweat. This is known as moderate-intensity exercise. ? Try to do strengthening exercises at least twice each week. Do these in addition to the moderate-intensity exercise.  Know your numbers.Ask your health care provider to check your cholesterol and your blood glucose. Continue to have your blood tested as directed by your health care provider.  What should I know about cancer screening? There are several types of cancer. Take the following steps to reduce your risk and to catch any cancer development as early as possible. Breast Cancer  Practice breast self-awareness. ? This means understanding how your breasts normally appear and feel. ? It also means doing regular breast self-exams. Let your health care provider know about any changes, no matter how small.  If you are 40 or  older, have a clinician do a breast exam (clinical breast exam or CBE) every year. Depending on your age, family history, and medical history, it may be recommended that you also have a yearly breast X-ray (mammogram).  If you  have a family history of breast cancer, talk with your health care provider about genetic screening.  If you are at high risk for breast cancer, talk with your health care provider about having an MRI and a mammogram every year.  Breast cancer (BRCA) gene test is recommended for women who have family members with BRCA-related cancers. Results of the assessment will determine the need for genetic counseling and BRCA1 and for BRCA2 testing. BRCA-related cancers include these types: ? Breast. This occurs in males or females. ? Ovarian. ? Tubal. This may also be called fallopian tube cancer. ? Cancer of the abdominal or pelvic lining (peritoneal cancer). ? Prostate. ? Pancreatic. Cervical, Uterine, and Ovarian Cancer Your health care provider may recommend that you be screened regularly for cancer of the pelvic organs. These include your ovaries, uterus, and vagina. This screening involves a pelvic exam, which includes checking for microscopic changes to the surface of your cervix (Pap test).  For women ages 21-65, health care providers may recommend a pelvic exam and a Pap test every three years. For women ages 39-65, they may recommend the Pap test and pelvic exam, combined with testing for human papilloma virus (HPV), every five years. Some types of HPV increase your risk of cervical cancer. Testing for HPV may also be done on women of any age who have unclear Pap test results.  Other health care providers may not recommend any screening for nonpregnant women who are considered low risk for pelvic cancer and have no symptoms. Ask your health care provider if a screening pelvic exam is right for you.  If you have had past treatment for cervical cancer or a condition that could lead to cancer, you need Pap tests and screening for cancer for at least 20 years after your treatment. If Pap tests have been discontinued for you, your risk factors (such as having a new sexual partner) need to be reassessed  to determine if you should start having screenings again. Some women have medical problems that increase the chance of getting cervical cancer. In these cases, your health care provider may recommend that you have screening and Pap tests more often.  If you have a family history of uterine cancer or ovarian cancer, talk with your health care provider about genetic screening.  If you have vaginal bleeding after reaching menopause, tell your health care provider.  There are currently no reliable tests available to screen for ovarian cancer. Lung Cancer Lung cancer screening is recommended for adults 57-50 years old who are at high risk for lung cancer because of a history of smoking. A yearly low-dose CT scan of the lungs is recommended if you:  Currently smoke.  Have a history of at least 30 pack-years of smoking and you currently smoke or have quit within the past 15 years. A pack-year is smoking an average of one pack of cigarettes per day for one year. Yearly screening should:  Continue until it has been 15 years since you quit.  Stop if you develop a health problem that would prevent you from having lung cancer treatment. Colorectal Cancer  This type of cancer can be detected and can often be prevented.  Routine colorectal cancer screening usually begins at age 12 and continues through  age 63.  If you have risk factors for colon cancer, your health care provider may recommend that you be screened at an earlier age.  If you have a family history of colorectal cancer, talk with your health care provider about genetic screening.  Your health care provider may also recommend using home test kits to check for hidden blood in your stool.  A small camera at the end of a tube can be used to examine your colon directly (sigmoidoscopy or colonoscopy). This is done to check for the earliest forms of colorectal cancer.  Direct examination of the colon should be repeated every 5-10 years until  age 75. However, if early forms of precancerous polyps or small growths are found or if you have a family history or genetic risk for colorectal cancer, you may need to be screened more often. Skin Cancer  Check your skin from head to toe regularly.  Monitor any moles. Be sure to tell your health care provider: ? About any new moles or changes in moles, especially if there is a change in a mole's shape or color. ? If you have a mole that is larger than the size of a pencil eraser.  If any of your family members has a history of skin cancer, especially at a young age, talk with your health care provider about genetic screening.  Always use sunscreen. Apply sunscreen liberally and repeatedly throughout the day.  Whenever you are outside, protect yourself by wearing long sleeves, pants, a wide-brimmed hat, and sunglasses. What should I know about osteoporosis? Osteoporosis is a condition in which bone destruction happens more quickly than new bone creation. After menopause, you may be at an increased risk for osteoporosis. To help prevent osteoporosis or the bone fractures that can happen because of osteoporosis, the following is recommended:  If you are 59-59 years old, get at least 1,000 mg of calcium and at least 600 mg of vitamin D per day.  If you are older than age 36 but younger than age 32, get at least 1,200 mg of calcium and at least 600 mg of vitamin D per day.  If you are older than age 47, get at least 1,200 mg of calcium and at least 800 mg of vitamin D per day. Smoking and excessive alcohol intake increase the risk of osteoporosis. Eat foods that are rich in calcium and vitamin D, and do weight-bearing exercises several times each week as directed by your health care provider. What should I know about how menopause affects my mental health? Depression may occur at any age, but it is more common as you become older. Common symptoms of depression include:  Low or sad mood.   Changes in sleep patterns.  Changes in appetite or eating patterns.  Feeling an overall lack of motivation or enjoyment of activities that you previously enjoyed.  Frequent crying spells. Talk with your health care provider if you think that you are experiencing depression. What should I know about immunizations? It is important that you get and maintain your immunizations. These include:  Tetanus, diphtheria, and pertussis (Tdap) booster vaccine.  Influenza every year before the flu season begins.  Pneumonia vaccine.  Shingles vaccine. Your health care provider may also recommend other immunizations. This information is not intended to replace advice given to you by your health care provider. Make sure you discuss any questions you have with your health care provider. Document Released: 03/21/2005 Document Revised: 08/17/2015 Document Reviewed: 10/31/2014 Elsevier Interactive Patient Education  2019 [place].

## 2018-06-17 ENCOUNTER — Telehealth: Payer: Self-pay

## 2018-06-17 LAB — TSH: TSH: 1.84 u[IU]/mL (ref 0.450–4.500)

## 2018-06-17 LAB — CBC WITH DIFFERENTIAL/PLATELET
Basophils Absolute: 0.1 10*3/uL (ref 0.0–0.2)
Basos: 1 %
EOS (ABSOLUTE): 0.4 10*3/uL (ref 0.0–0.4)
Eos: 6 %
Hematocrit: 45.9 % (ref 34.0–46.6)
Hemoglobin: 15.1 g/dL (ref 11.1–15.9)
Immature Grans (Abs): 0 10*3/uL (ref 0.0–0.1)
Immature Granulocytes: 0 %
Lymphocytes Absolute: 1.9 10*3/uL (ref 0.7–3.1)
Lymphs: 33 %
MCH: 28.9 pg (ref 26.6–33.0)
MCHC: 32.9 g/dL (ref 31.5–35.7)
MCV: 88 fL (ref 79–97)
Monocytes Absolute: 0.5 10*3/uL (ref 0.1–0.9)
Monocytes: 8 %
Neutrophils Absolute: 3.1 10*3/uL (ref 1.4–7.0)
Neutrophils: 52 %
Platelets: 250 10*3/uL (ref 150–450)
RBC: 5.23 x10E6/uL (ref 3.77–5.28)
RDW: 13.4 % (ref 11.7–15.4)
WBC: 5.9 10*3/uL (ref 3.4–10.8)

## 2018-06-17 LAB — COMPREHENSIVE METABOLIC PANEL
ALT: 28 IU/L (ref 0–32)
AST: 20 IU/L (ref 0–40)
Albumin/Globulin Ratio: 2 (ref 1.2–2.2)
Albumin: 4.4 g/dL (ref 3.8–4.8)
Alkaline Phosphatase: 102 IU/L (ref 39–117)
BUN/Creatinine Ratio: 16 (ref 12–28)
BUN: 13 mg/dL (ref 8–27)
Bilirubin Total: 0.3 mg/dL (ref 0.0–1.2)
CO2: 19 mmol/L — ABNORMAL LOW (ref 20–29)
Calcium: 9.5 mg/dL (ref 8.7–10.3)
Chloride: 105 mmol/L (ref 96–106)
Creatinine, Ser: 0.79 mg/dL (ref 0.57–1.00)
GFR calc Af Amer: 93 mL/min/{1.73_m2} (ref >59)
GFR calc non Af Amer: 80 mL/min/{1.73_m2} (ref >59)
Globulin, Total: 2.2 g/dL (ref 1.5–4.5)
Glucose: 84 mg/dL (ref 65–99)
Potassium: 4.2 mmol/L (ref 3.5–5.2)
Sodium: 141 mmol/L (ref 134–144)
Total Protein: 6.6 g/dL (ref 6.0–8.5)

## 2018-06-17 LAB — LIPID PANEL
Chol/HDL Ratio: 4.6 ratio — ABNORMAL HIGH (ref 0.0–4.4)
Cholesterol, Total: 214 mg/dL — ABNORMAL HIGH (ref 100–199)
HDL: 47 mg/dL (ref >39)
LDL Calculated: 130 mg/dL — ABNORMAL HIGH (ref 0–99)
Triglycerides: 183 mg/dL — ABNORMAL HIGH (ref 0–149)
VLDL Cholesterol Cal: 37 mg/dL (ref 5–40)

## 2018-06-17 LAB — HEMOGLOBIN A1C
Est. average glucose Bld gHb Est-mCnc: 105 mg/dL
Hgb A1c MFr Bld: 5.3 % (ref 4.8–5.6)

## 2018-06-17 NOTE — Telephone Encounter (Signed)
-----   Message from Mar Daring, [place] sent at 06/17/2018  9:36 AM EDT ----- Blood count is normal. Kidney and liver function are normal. Potassium, sodium and calcium are all normal. A1c is great and stable at 5.3. Cholesterol up slightly from last year. Make sure to be taking simvastatin. Thyroid is normal. Pap is still pending. I will let you know results once received.

## 2018-06-17 NOTE — Telephone Encounter (Signed)
Patient advised as directed below. Per patient she is taking the simvastatin.

## 2018-06-17 NOTE — Telephone Encounter (Signed)
LMTCB

## 2018-06-18 ENCOUNTER — Telehealth: Payer: Self-pay

## 2018-06-18 LAB — CYTOLOGY - PAP
Diagnosis: NEGATIVE
HPV: NOT DETECTED

## 2018-06-18 NOTE — Telephone Encounter (Signed)
-----   Message from Mar Daring, PA-C sent at 06/18/2018  1:16 PM EDT ----- Pap is normal, HPV negative.  Will repeat at 66 if desired.

## 2018-06-18 NOTE — Telephone Encounter (Signed)
Patient advised as directed below.  Thanks,  -Chanoch Mccleery 

## 2018-07-21 IMAGING — MG MM DIGITAL SCREENING BILAT W/ TOMO W/ CAD
8 series · 8 of 24 positions shown · non-contrast
Comparison: Previous exam(s).

CLINICAL DATA: Screening.

EXAM:
DIGITAL SCREENING BILATERAL MAMMOGRAM WITH TOMO AND CAD

[R MLO synth-2D]
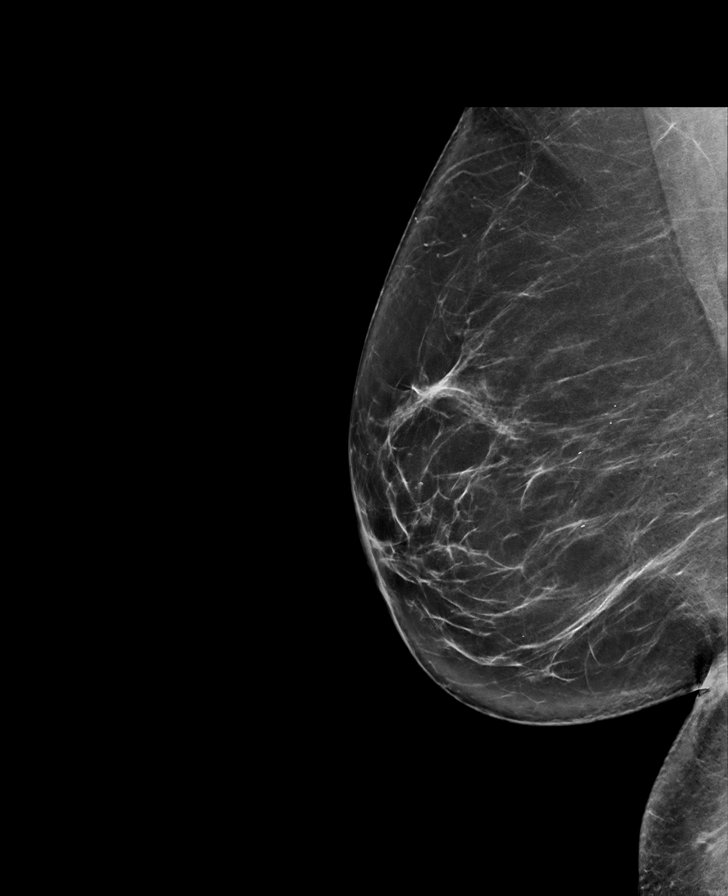

[L MLO synth-2D]
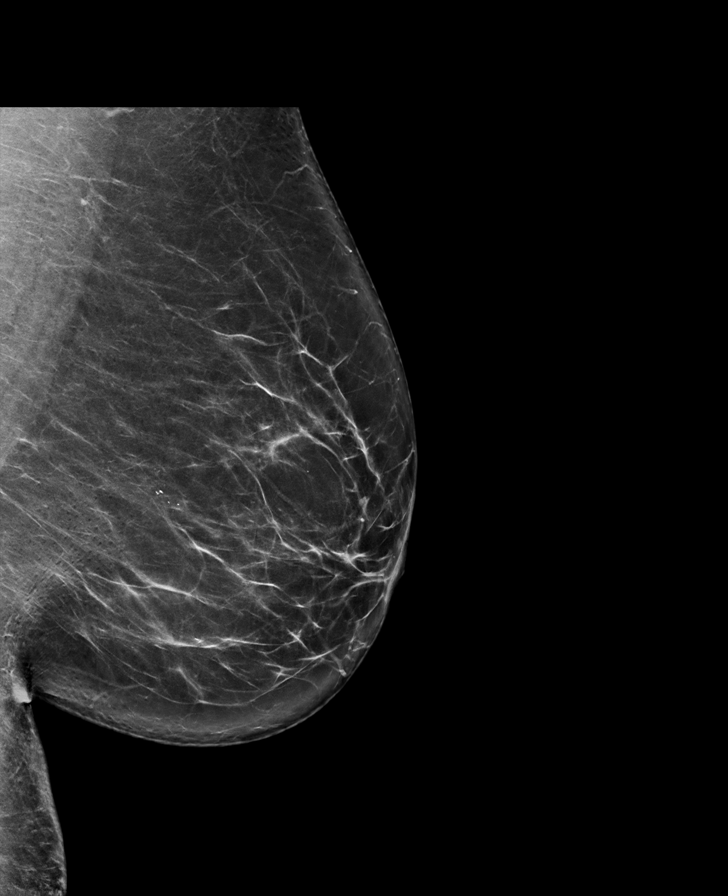

[L CC synth-2D]
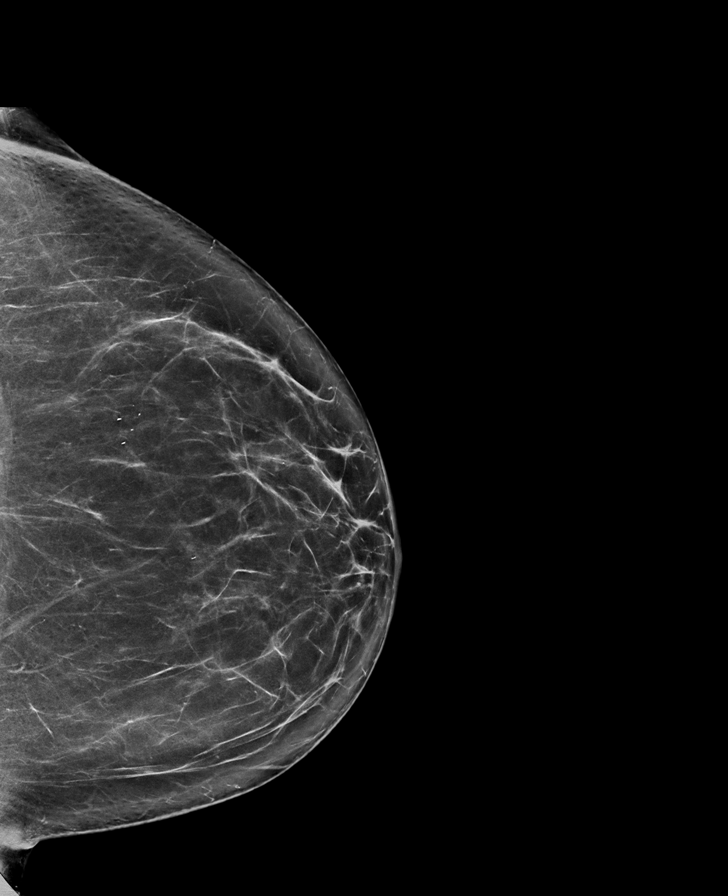

[R CC synth-2D]
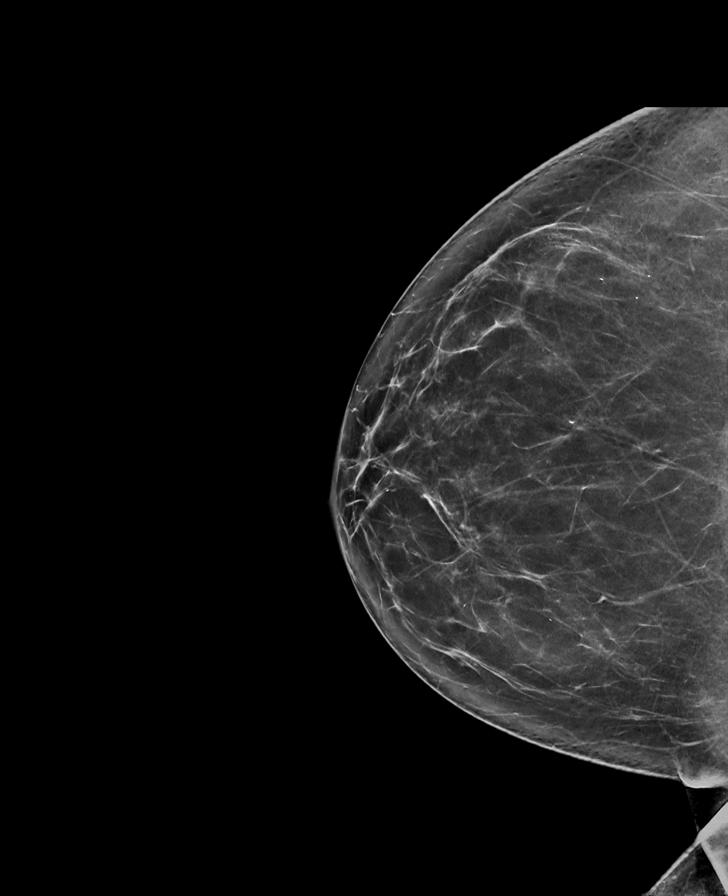

[L MLO tomo · tomo slice 45/90.0]
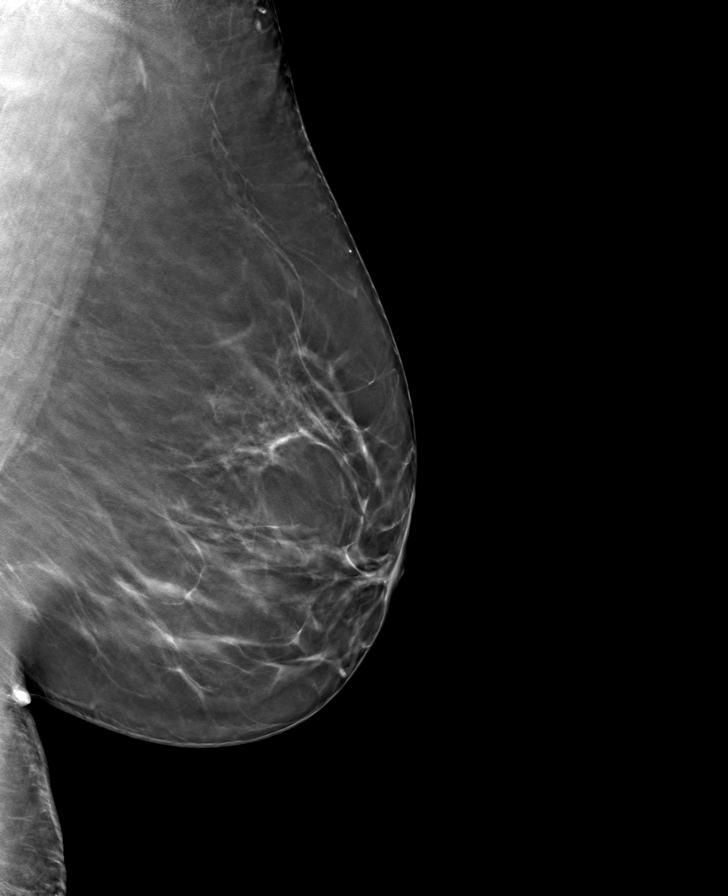

[L CC tomo · tomo slice 40/79.0]
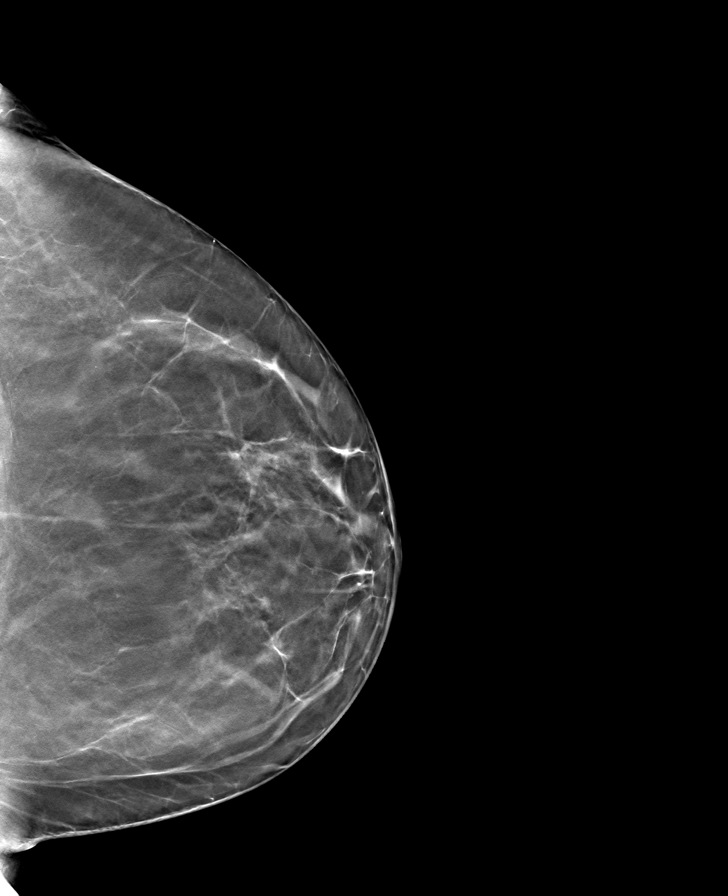

[R MLO tomo · tomo slice 46/91.0]
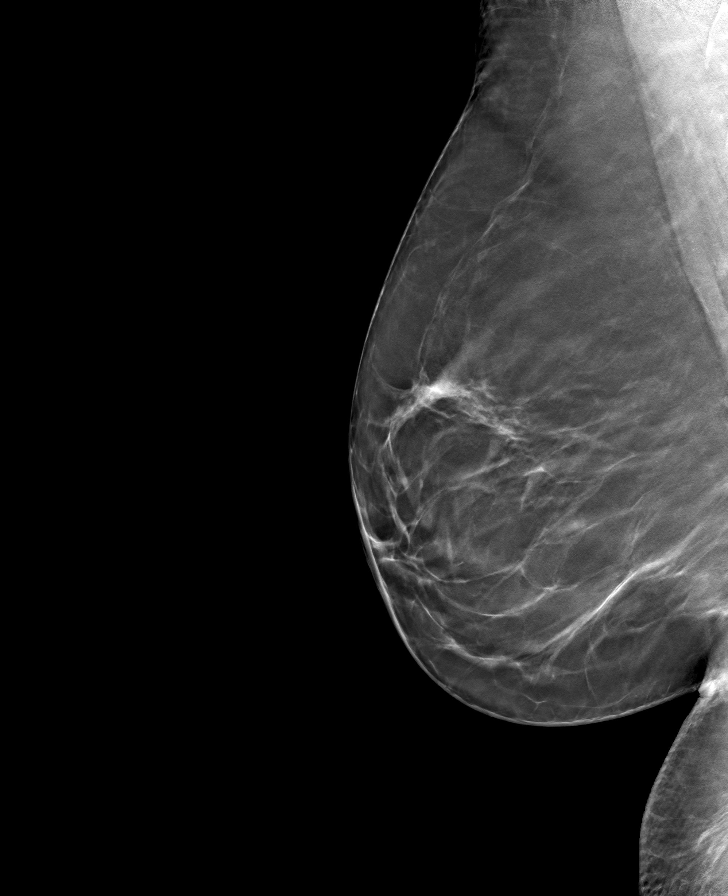

[R CC tomo · tomo slice 39/76.0]
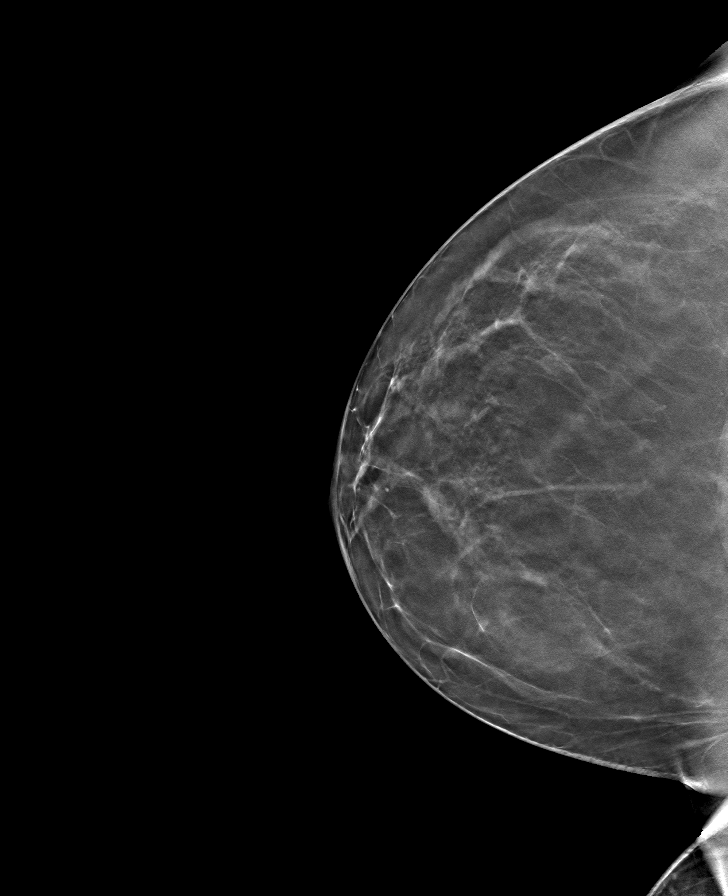

[8 of 24 positions shown; findings below may reference images not displayed]

ACR Breast Density Category b: There are scattered areas of
fibroglandular density.
FINDINGS: There are no findings suspicious for malignancy. Images were
processed with CAD.
IMPRESSION: No mammographic evidence of malignancy. A result letter of this
screening mammogram will be mailed directly to the patient.

RECOMMENDATION:
Screening mammogram in one year. (Code:[id number])

BI-RADS CATEGORY  1: Negative.

## 2018-08-17 ENCOUNTER — Other Ambulatory Visit: Payer: Self-pay

## 2018-08-17 ENCOUNTER — Ambulatory Visit
Admission: RE | Admit: 2018-08-17 | Discharge: 2018-08-17 | Disposition: A | Discharge status: Home / Self Care | Payer: 59 | Source: Ambulatory Visit | Attending: Physician Assistant | Admitting: Physician Assistant

## 2018-08-17 DIAGNOSIS — Z1231 Encounter for screening mammogram for malignant neoplasm of breast: Secondary | ICD-10-CM | POA: Insufficient documentation

## 2018-08-17 DIAGNOSIS — Z803 Family history of malignant neoplasm of breast: Secondary | ICD-10-CM

## 2018-08-17 DIAGNOSIS — Z1239 Encounter for other screening for malignant neoplasm of breast: Secondary | ICD-10-CM | POA: Diagnosis present

## 2018-08-18 ENCOUNTER — Telehealth: Payer: Self-pay

## 2018-08-18 NOTE — Telephone Encounter (Signed)
Viewed by Manual Meier on 08/17/2018 6:09 PM Written by Mar Daring, PA-C on 08/17/2018 5:23 PM Normal mammogram. Repeat screening in one year.

## 2018-08-18 NOTE — Telephone Encounter (Signed)
-----   Message from Mar Daring, [place] sent at 08/17/2018  5:23 PM EDT ----- Normal mammogram. Repeat screening in one year.

## 2019-03-15 ENCOUNTER — Encounter: Payer: Self-pay | Admitting: Family Medicine

## 2019-03-15 ENCOUNTER — Telehealth (INDEPENDENT_AMBULATORY_CARE_PROVIDER_SITE_OTHER): Payer: 59 | Admitting: Family Medicine

## 2019-03-15 ENCOUNTER — Other Ambulatory Visit: Payer: Self-pay

## 2019-03-15 VITALS — Temp 98.1°F

## 2019-03-15 DIAGNOSIS — J0100 Acute maxillary sinusitis, unspecified: Secondary | ICD-10-CM | POA: Diagnosis not present

## 2019-03-15 MED ORDER — AZITHROMYCIN 250 MG PO TABS: ORAL_TABLET | ORAL | 0 refills | Status: AC

## 2019-03-15 NOTE — Progress Notes (Signed)
Patient: Katherine White Female    DOB: August 27, 1955   64 y.o.   MRN: [id number] Visit Date: 03/15/2019  Today's Provider: Lelon Huh, MD   Chief Complaint  Patient presents with  . Sore Throat   Subjective:     Sore Throat  This is a new problem. The current episode started yesterday. The problem has been unchanged. The pain is worse on the right side. There has been no fever. Associated symptoms include congestion (sinus congestion), headaches, a hoarse voice and neck pain. Pertinent negatives include no abdominal pain, coughing, shortness of breath, trouble swallowing or vomiting. Associated symptoms comments: Mucus production . She has tried acetaminophen (also Coricidin HBP, and hot tea) for the symptoms. The treatment provided mild relief.  No loss of taste or smell. Has had some sweats. No known Covid exposure. Her grandson had URI and was tested for Covid yesterday which was negative  Allergies  Allergen Reactions  . Aspirin Other (See Comments)    Cannot tolerate large doses GI upset (High dosage) Can take 81 mg  . Codeine   . Codeine Sulfate Other (See Comments)     Current Outpatient Medications:  .  albuterol (PROVENTIL HFA;VENTOLIN HFA) 108 (90 Base) MCG/ACT inhaler, Inhale 1-2 puffs every 4 (four) hours as needed into the lungs for wheezing or shortness of breath., Disp: 1 Inhaler, Rfl: 0 .  aspirin 81 MG tablet, Take 1 tablet by mouth daily., Disp: , Rfl:  .  carvedilol (COREG) 3.125 MG tablet, Take 1 tablet by mouth daily., Disp: , Rfl:  .  Cholecalciferol (VITAMIN D) 2000 UNITS CAPS, Take 1 capsule by mouth daily., Disp: , Rfl:  .  fluticasone (FLONASE) 50 MCG/ACT nasal spray, Place 2 sprays into both nostrils daily., Disp: 16 g, Rfl: 3 .  Ivermectin (SOOLANTRA) 1 % CREA, Apply topically once daily., Disp: , Rfl:  .  loratadine (CLARITIN REDITABS) 10 MG dissolvable tablet, Take 1 tablet by mouth daily., Disp: , Rfl:  .  losartan (COZAAR) 25 MG tablet,  Take 1 tablet by mouth daily., Disp: , Rfl:  .  Magnesium Citrate 100 MG TABS, Take 1 tablet by mouth daily. Reported on 06/05/2015, Disp: , Rfl:  .  MULTIPLE VITAMIN PO, Take 1 tablet by mouth daily., Disp: , Rfl:  .  nitroGLYCERIN (NITROSTAT) 0.4 MG SL tablet, Place 1 tablet under the tongue as needed. Reported on 06/05/2015, Disp: , Rfl:  .  pyridoxine (B6 NATURAL) 100 MG tablet, Take 1 tablet by mouth daily., Disp: , Rfl:  .  simvastatin (ZOCOR) 40 MG tablet, TAKE ONE-HALF TABLET DAILY, Disp: 30 tablet, Rfl: 5 .  tetrahydrozoline-zinc (EYE DROPS RELIEF) 0.05-0.25 % ophthalmic solution, Apply 1 drop to eye daily., Disp: , Rfl:   Review of Systems  Constitutional: Negative for appetite change, chills, fatigue and fever.  HENT: Positive for congestion (sinus congestion), hoarse voice, sinus pressure, sinus pain and sore throat. Negative for trouble swallowing.   Respiratory: Negative for cough, chest tightness and shortness of breath.   Cardiovascular: Negative for chest pain and palpitations.  Gastrointestinal: Negative for abdominal pain, nausea and vomiting.  Musculoskeletal: Positive for neck pain.  Neurological: Positive for headaches. Negative for dizziness and weakness.    Social History   Tobacco Use  . Smoking status: Former Smoker    Years: 1.00    Types: Cigarettes  . Smokeless tobacco: Never Used  Substance Use Topics  . Alcohol use: Yes    Alcohol/week: 0.0  standard drinks    Comment: OCCASIONALLY      Objective:   Temp 98.1 F (36.7 C) (Temporal)  Vitals:   03/15/19 0934  Temp: 98.1 F (36.7 C)  TempSrc: Temporal  There is no height or weight on file to calculate BMI.   Physical Exam  Awake, alert, oriented x 3. In no apparent distress  No results found for any visits on 03/15/19.     Assessment & Plan    1. Acute non-recurrent maxillary sinusitis  - azithromycin (ZITHROMAX) 250 MG tablet; 2 by mouth today, then 1 daily for 4 days  Dispense: 6 tablet;  Refill: 0  Call if symptoms change or if not rapidly improving.      The entirety of the information documented in the History of Present Illness, Review of Systems and Physical Exam were personally obtained by me. Portions of this information were initially documented by Meyer Cory, CMA and reviewed by me for thoroughness and accuracy.      Lelon Huh, MD  [facility]

## 2019-07-04 ENCOUNTER — Other Ambulatory Visit: Payer: Self-pay | Admitting: Physician Assistant

## 2019-07-04 ENCOUNTER — Telehealth: Payer: Self-pay | Admitting: Physician Assistant

## 2019-07-04 DIAGNOSIS — Z1231 Encounter for screening mammogram for malignant neoplasm of breast: Secondary | ICD-10-CM

## 2019-07-04 NOTE — Telephone Encounter (Signed)
Patient is calling to request a referral for a mammogram to the [facility] Please advise CB- [phone]

## 2019-07-04 NOTE — Telephone Encounter (Signed)
Patient advised referral placed  

## 2019-08-11 ENCOUNTER — Encounter: Payer: Self-pay | Admitting: Physician Assistant

## 2019-08-11 ENCOUNTER — Other Ambulatory Visit: Payer: Self-pay

## 2019-08-11 ENCOUNTER — Ambulatory Visit (INDEPENDENT_AMBULATORY_CARE_PROVIDER_SITE_OTHER): Payer: 59 | Admitting: Physician Assistant

## 2019-08-11 VITALS — BP 120/85 | HR 87 | Temp 97.0°F | Resp 16 | Ht 61.0 in | Wt 160.6 lb

## 2019-08-11 DIAGNOSIS — I10 Essential (primary) hypertension: Secondary | ICD-10-CM | POA: Diagnosis not present

## 2019-08-11 DIAGNOSIS — Z1239 Encounter for other screening for malignant neoplasm of breast: Secondary | ICD-10-CM

## 2019-08-11 DIAGNOSIS — Z803 Family history of malignant neoplasm of breast: Secondary | ICD-10-CM

## 2019-08-11 DIAGNOSIS — Z1329 Encounter for screening for other suspected endocrine disorder: Secondary | ICD-10-CM

## 2019-08-11 DIAGNOSIS — E6609 Other obesity due to excess calories: Secondary | ICD-10-CM

## 2019-08-11 DIAGNOSIS — I209 Angina pectoris, unspecified: Secondary | ICD-10-CM

## 2019-08-11 DIAGNOSIS — Z6830 Body mass index (BMI) 30.0-30.9, adult: Secondary | ICD-10-CM

## 2019-08-11 DIAGNOSIS — E559 Vitamin D deficiency, unspecified: Secondary | ICD-10-CM

## 2019-08-11 DIAGNOSIS — E7800 Pure hypercholesterolemia, unspecified: Secondary | ICD-10-CM

## 2019-08-11 DIAGNOSIS — Z0000 Encounter for general adult medical examination without abnormal findings: Secondary | ICD-10-CM

## 2019-08-11 NOTE — Progress Notes (Signed)
Complete physical exam   Patient: Katherine White   DOB: Feb 21, 1955   64 y.o. Female  MRN: [id number] Visit Date: 08/11/2019  Today's healthcare provider: Mar Daring, PA-C   Chief Complaint  Patient presents with  . Annual Exam   Subjective    Katherine White is a 64 y.o. female who presents today for a complete physical exam.  She reports consuming a Mediterrenean kind off diet diet. The patient does not participate in regular exercise at present. She generally feels well. She reports sleeping fairly well. She does have additional problems to discuss today.  HPI  Pap is normal, HPV negative. Will repeat at 66 if desired. Mammogram:Scheduled 08/18/2019 [place] [facility]  Patient c/o bumps on leg and chest. She has a mole on her bra line. She has a Paediatric nurse and is due to follow up in October.  Past Medical History:  Diagnosis Date  . Hypertension    Past Surgical History:  Procedure Laterality Date  . CARDIAC CATHETERIZATION  2011  . COLONOSCOPY WITH PROPOFOL N/A 08/21/2016   Procedure: COLONOSCOPY WITH PROPOFOL;  Surgeon: Jonathon Bellows, MD;  Location: [facility] ENDOSCOPY;  Service: Endoscopy;  Laterality: N/A;  . DILATION AND CURETTAGE OF UTERUS  1980, 1981   AFTER 2 MISCARRIAGES  . LEEP  1999  . TONSILLECTOMY    . TONSILLECTOMY AND ADENOIDECTOMY  1964   Social History   Socioeconomic History  . Marital status: Married    Spouse name: Belenda Cruise  . Number of children: 4  . Years of education: some college  . Highest education level: Not on file  Occupational History    Employer: RETIRED  Tobacco Use  . Smoking status: Former Smoker    Years: 1.00    Types: Cigarettes  . Smokeless tobacco: Never Used  Vaping Use  . Vaping Use: Never used  Substance and Sexual Activity  . Alcohol use: Yes    Alcohol/week: 0.0 standard drinks    Comment: OCCASIONALLY  . Drug use: No  . Sexual activity: Yes  Other Topics Concern   . Not on file  Social History Narrative  . Not on file   Social Determinants of Health   Financial Resource Strain:   . Difficulty of Paying Living Expenses:   Food Insecurity:   . Worried About Charity fundraiser in the Last Year:   . Arboriculturist in the Last Year:   Transportation Needs:   . Film/video editor (Medical):   Marland Kitchen Lack of Transportation (Non-Medical):   Physical Activity:   . Days of Exercise per Week:   . Minutes of Exercise per Session:   Stress:   . Feeling of Stress :   Social Connections:   . Frequency of Communication with Friends and Family:   . Frequency of Social Gatherings with Friends and Family:   . Attends Religious Services:   . Active Member of Clubs or Organizations:   . Attends Archivist Meetings:   Marland Kitchen Marital Status:   Intimate Partner Violence:   . Fear of Current or Ex-Partner:   . Emotionally Abused:   Marland Kitchen Physically Abused:   . Sexually Abused:    Family Status  Relation Name Status  . Brother 1 Alive  . [employer]  . Annamarie Major  Deceased  . MGM  Deceased  . PGM  Deceased  . Mother  Deceased  . Father  Deceased  .  MGF  Deceased  . Brother 2 Alive  . Other MGAunt (Not Specified)  . Cousin maternal Alive   Family History  Problem Relation Age of Onset  . Hypertension Brother   . Breast cancer Maternal Aunt   . Diabetes Paternal Uncle   . Diabetes Maternal Grandmother   . Pneumonia Paternal Grandmother   . Coronary artery disease Mother   . Liver cancer Mother   . Heart disease Mother   . Breast cancer Mother 80  . Heart disease Father   . Bladder Cancer Father   . Heart attack Maternal Grandfather   . Hyperlipidemia Other   . Diabetes Other   . Heart disease Other   . Breast cancer Other        great aunt  . Breast cancer Cousin    Allergies  Allergen Reactions  . Aspirin Other (See Comments)    Cannot tolerate large doses GI upset (High dosage) Can take 81 mg  . Codeine   . Codeine Sulfate  Other (See Comments)    Patient Care Team: Rubye Beach as PCP - General (Physician Assistant)   Medications: Outpatient Medications Prior to Visit  Medication Sig  . albuterol (PROVENTIL HFA;VENTOLIN HFA) 108 (90 Base) MCG/ACT inhaler Inhale 1-2 puffs every 4 (four) hours as needed into the lungs for wheezing or shortness of breath.  Marland Kitchen aspirin 81 MG tablet Take 1 tablet by mouth daily.  . carvedilol (COREG) 3.125 MG tablet Take 1 tablet by mouth daily.  . Cholecalciferol (VITAMIN D) 2000 UNITS CAPS Take 1 capsule by mouth daily.  . fluticasone (FLONASE) 50 MCG/ACT nasal spray Place 2 sprays into both nostrils daily.  . Ivermectin (SOOLANTRA) 1 % CREA Apply topically once daily.  Marland Kitchen loratadine (CLARITIN REDITABS) 10 MG dissolvable tablet Take 1 tablet by mouth daily.  Marland Kitchen losartan (COZAAR) 25 MG tablet Take 1 tablet by mouth daily.  . Magnesium Citrate 100 MG TABS Take 1 tablet by mouth daily. Reported on 06/05/2015  . MULTIPLE VITAMIN PO Take 1 tablet by mouth daily.  . nitroGLYCERIN (NITROSTAT) 0.4 MG SL tablet Place 1 tablet under the tongue as needed. Reported on 06/05/2015  . pyridoxine (B6 NATURAL) 100 MG tablet Take 1 tablet by mouth daily.  . simvastatin (ZOCOR) 40 MG tablet TAKE ONE-HALF TABLET DAILY  . tetrahydrozoline-zinc (EYE DROPS RELIEF) 0.05-0.25 % ophthalmic solution Apply 1 drop to eye daily.   No facility-administered medications prior to visit.    Review of Systems  Constitutional: Negative.   HENT: Negative.   Eyes: Negative.   Respiratory: Negative.   Cardiovascular: Negative.   Gastrointestinal: Negative.   Endocrine: Negative.   Genitourinary: Negative.   Musculoskeletal: Negative.   Skin: Negative.   Allergic/Immunologic: Positive for environmental allergies.  Neurological: Negative.   Hematological: Negative.   Psychiatric/Behavioral: Negative.     Last CBC Lab Results  Component Value Date   WBC 6.9 08/11/2019   HGB 14.6 08/11/2019    HCT 44.1 08/11/2019   MCV 87 08/11/2019   MCH 28.9 08/11/2019   RDW 13.2 08/11/2019   PLT 254 62/69/4854   Last metabolic panel Lab Results  Component Value Date   GLUCOSE 82 08/11/2019   NA 141 08/11/2019   K 4.8 08/11/2019   CL 104 08/11/2019   CO2 23 08/11/2019   BUN 11 08/11/2019   CREATININE 0.81 08/11/2019   GFRNONAA 78 08/11/2019   GFRAA 89 08/11/2019   CALCIUM 9.7 08/11/2019   PROT 6.5 08/11/2019  ALBUMIN 4.3 08/11/2019   LABGLOB 2.2 08/11/2019   AGRATIO 2.0 08/11/2019   BILITOT 0.4 08/11/2019   ALKPHOS 105 08/11/2019   AST 21 08/11/2019   ALT 18 08/11/2019      Objective    BP 120/85 (BP Location: Left Arm, Patient Position: Sitting, Cuff Size: Normal)   Pulse 87   Temp (!) 97 F (36.1 C) (Temporal)   Resp 16   Ht 5\' 1"  (1.549 m)   Wt 160 lb 9.6 oz (72.8 kg)   BMI 30.35 kg/m  BP Readings from Last 3 Encounters:  08/11/19 120/85  06/16/18 113/74  06/11/17 98/80   Wt Readings from Last 3 Encounters:  08/11/19 160 lb 9.6 oz (72.8 kg)  06/16/18 155 lb 11.2 oz (70.6 kg)  06/11/17 149 lb (67.6 kg)      Physical Exam Vitals reviewed.  Constitutional:      General: She is not in acute distress.    Appearance: Normal appearance. She is well-developed. She is not ill-appearing or diaphoretic.  HENT:     Head: Normocephalic and atraumatic.     Right Ear: Tympanic membrane, ear canal and external ear normal.     Left Ear: Tympanic membrane, ear canal and external ear normal.     Nose: Nose normal.     Mouth/Throat:     Mouth: Mucous membranes are moist.     Pharynx: Oropharynx is clear. No oropharyngeal exudate or posterior oropharyngeal erythema.  Eyes:     General: No scleral icterus.       Right eye: No discharge.        Left eye: No discharge.     Extraocular Movements: Extraocular movements intact.     Conjunctiva/sclera: Conjunctivae normal.     Pupils: Pupils are equal, round, and reactive to light.  Neck:     Thyroid: No thyromegaly.      Vascular: No carotid bruit or JVD.     Trachea: No tracheal deviation.  Cardiovascular:     Rate and Rhythm: Normal rate and regular rhythm.     Pulses: Normal pulses.     Heart sounds: Normal heart sounds. No murmur heard.  No friction rub. No gallop.   Pulmonary:     Effort: Pulmonary effort is normal. No respiratory distress.     Breath sounds: Normal breath sounds. No wheezing or rales.  Chest:     Chest wall: No tenderness.  Abdominal:     General: Abdomen is flat. Bowel sounds are normal. There is no distension.     Palpations: Abdomen is soft. There is no mass.     Tenderness: There is no abdominal tenderness. There is no guarding or rebound.  Musculoskeletal:        General: No tenderness. Normal range of motion.     Cervical back: Normal range of motion and neck supple.  Lymphadenopathy:     Cervical: No cervical adenopathy.  Skin:    General: Skin is warm and dry.     Capillary Refill: Capillary refill takes less than 2 seconds.     Findings: No rash.  Neurological:     General: No focal deficit present.     Mental Status: She is alert and oriented to person, place, and time. Mental status is at baseline.  Psychiatric:        Mood and Affect: Mood normal.        Behavior: Behavior normal.        Thought Content: Thought content normal.  Judgment: Judgment normal.      Last depression screening scores PHQ 2/9 Scores 08/11/2019 06/16/2018 06/11/2017  PHQ - 2 Score 0 0 0  PHQ- 9 Score - - -   Last fall risk screening Fall Risk  08/11/2019  Falls in the past year? 0  Number falls in past yr: 0  Injury with Fall? 0  Risk for fall due to : No Fall Risks  Follow up Falls evaluation completed   Last Audit-C alcohol use screening Alcohol Use Disorder Test (AUDIT) 08/11/2019  1. How often do you have a drink containing alcohol? 1  2. How many drinks containing alcohol do you have on a typical day when you are drinking? 0  3. How often do you have six or more drinks on  one occasion? 0  AUDIT-C Score 1  Alcohol Brief Interventions/Follow-up AUDIT Score <7 follow-up not indicated   A score of 3 or more in women, and 4 or more in men indicates increased risk for alcohol abuse, EXCEPT if all of the points are from question 1   No results found for any visits on 08/11/19.  Assessment & Plan    Routine Health Maintenance and Physical Exam  Exercise Activities and Dietary recommendations Goals   None     Immunization History  Administered Date(s) Administered  . Influenza Split 10/23/2009, 11/21/2010  . Influenza,inj,Quad PF,6+ Mos 11/09/2013, 11/17/2014, 03/15/2016  . Influenza-Unspecified 12/03/2016, 02/02/2019  . PFIZER SARS-COV-2 Vaccination 05/11/2019, 06/01/2019  . Tdap 03/04/2006, 12/29/2013  . Zoster 04/10/2011  . Zoster Recombinat (Shingrix) 10/06/2017    Health Maintenance  Topic Date Due  . MAMMOGRAM  08/17/2019  . INFLUENZA VACCINE  09/11/2019  . COLONOSCOPY  08/21/2021  . PAP SMEAR-Modifier  06/16/2023  . TETANUS/TDAP  12/30/2023  . COVID-19 Vaccine  Completed  . Hepatitis C Screening  Completed  . HIV Screening  Completed    Discussed health benefits of physical activity, and encouraged her to engage in regular exercise appropriate for her age and condition.  1. Routine general medical examination at a health care facility Normal physical exam today. Will check labs as below and f/u pending lab results. If labs are stable and WNL she will not need to have these rechecked for one year at her next annual physical exam. She is to call the office in the meantime if she has any acute issue, questions or concerns.  2. Encounter for breast cancer screening using non-mammogram modality Already scheduled for 08/18/19.  3. Screening for thyroid disorder Will check labs as below and f/u pending results. - TSH  4. Essential hypertension Stable. Continue losartan 25mg . Will check labs as below and f/u pending results. - Comprehensive  metabolic panel - Lipid panel - CBC with Differential/Platelet - Hemoglobin A1c  5. Avitaminosis D H/O this and postmenopausal estrogen def. Will check labs as below and f/u pending results. - CBC with Differential/Platelet  6. Hypercholesterolemia without hypertriglyceridemia Stable. Continue Simvastatin 40mg . Will check labs as below and f/u pending results. - Comprehensive metabolic panel - Lipid panel - CBC with Differential/Platelet - Hemoglobin A1c  7. Family history of breast cancer in mother Mammogram scheduled.   8. Class 1 obesity due to excess calories with serious comorbidity and body mass index (BMI) of 30.0 to 30.9 in adult Counseled patient on healthy lifestyle modifications including dieting and exercise.  - Comprehensive metabolic panel - Lipid panel - CBC with Differential/Platelet - Hemoglobin A1c  9. Angina pectoris (HCC) Stable. Followed by Cardiology. Will  check labs as below and f/u pending results. - Comprehensive metabolic panel - Lipid panel - CBC with Differential/Platelet - Hemoglobin A1c   No follow-ups on file.     Reynolds Bowl, PA-C, have reviewed all documentation for this visit. The documentation on 08/21/19 for the exam, diagnosis, procedures, and orders are all accurate and complete.   Rubye Beach  [facility] [phone] (phone) [phone] (fax)  [place]

## 2019-08-11 NOTE — Patient Instructions (Signed)
Health Maintenance, Female Adopting a healthy lifestyle and getting preventive care are important in promoting health and wellness. Ask your health care provider about:  The right schedule for you to have regular tests and exams.  Things you can do on your own to prevent diseases and keep yourself healthy. What should I know about diet, weight, and exercise? Eat a healthy diet   Eat a diet that includes plenty of vegetables, fruits, low-fat dairy products, and lean protein.  Do not eat a lot of foods that are high in solid fats, added sugars, or sodium. Maintain a healthy weight Body mass index (BMI) is used to identify weight problems. It estimates body fat based on height and weight. Your health care provider can help determine your BMI and help you achieve or maintain a healthy weight. Get regular exercise Get regular exercise. This is one of the most important things you can do for your health. Most adults should:  Exercise for at least 150 minutes each week. The exercise should increase your heart rate and make you sweat (moderate-intensity exercise).  Do strengthening exercises at least twice a week. This is in addition to the moderate-intensity exercise.  Spend less time sitting. Even light physical activity can be beneficial. Watch cholesterol and blood lipids Have your blood tested for lipids and cholesterol at 63 years of age, then have this test every 5 years. Have your cholesterol levels checked more often if:  Your lipid or cholesterol levels are high.  You are older than 63 years of age.  You are at high risk for heart disease. What should I know about cancer screening? Depending on your health history and family history, you may need to have cancer screening at various ages. This may include screening for:  Breast cancer.  Cervical cancer.  Colorectal cancer.  Skin cancer.  Lung cancer. What should I know about heart disease, diabetes, and high blood  pressure? Blood pressure and heart disease  High blood pressure causes heart disease and increases the risk of stroke. This is more likely to develop in people who have high blood pressure readings, are of African descent, or are overweight.  Have your blood pressure checked: ? Every 3-5 years if you are 18-39 years of age. ? Every year if you are 40 years old or older. Diabetes Have regular diabetes screenings. This checks your fasting blood sugar level. Have the screening done:  Once every three years after age 40 if you are at a normal weight and have a low risk for diabetes.  More often and at a younger age if you are overweight or have a high risk for diabetes. What should I know about preventing infection? Hepatitis B If you have a higher risk for hepatitis B, you should be screened for this virus. Talk with your health care provider to find out if you are at risk for hepatitis B infection. Hepatitis C Testing is recommended for:  Everyone born from 1945 through 1965.  Anyone with known risk factors for hepatitis C. Sexually transmitted infections (STIs)  Get screened for STIs, including gonorrhea and chlamydia, if: ? You are sexually active and are younger than 63 years of age. ? You are older than 63 years of age and your health care provider tells you that you are at risk for this type of infection. ? Your sexual activity has changed since you were last screened, and you are at increased risk for chlamydia or gonorrhea. Ask your health care provider if   you are at risk.  Ask your health care provider about whether you are at high risk for HIV. Your health care provider may recommend a prescription medicine to help prevent HIV infection. If you choose to take medicine to prevent HIV, you should first get tested for HIV. You should then be tested every 3 months for as long as you are taking the medicine. Pregnancy  If you are about to stop having your period (premenopausal) and  you may become pregnant, seek counseling before you get pregnant.  Take 400 to 800 micrograms (mcg) of folic acid every day if you become pregnant.  Ask for birth control (contraception) if you want to prevent pregnancy. Osteoporosis and menopause Osteoporosis is a disease in which the bones lose minerals and strength with aging. This can result in bone fractures. If you are 65 years old or older, or if you are at risk for osteoporosis and fractures, ask your health care provider if you should:  Be screened for bone loss.  Take a calcium or vitamin D supplement to lower your risk of fractures.  Be given hormone replacement therapy (HRT) to treat symptoms of menopause. Follow these instructions at home: Lifestyle  Do not use any products that contain nicotine or tobacco, such as cigarettes, e-cigarettes, and chewing tobacco. If you need help quitting, ask your health care provider.  Do not use street drugs.  Do not share needles.  Ask your health care provider for help if you need support or information about quitting drugs. Alcohol use  Do not drink alcohol if: ? Your health care provider tells you not to drink. ? You are pregnant, may be pregnant, or are planning to become pregnant.  If you drink alcohol: ? Limit how much you use to 0-1 drink a day. ? Limit intake if you are breastfeeding.  Be aware of how much alcohol is in your drink. In the [place] one drink equals one 12 oz bottle of beer (355 mL), one 5 oz glass of wine (148 mL), or one 1 oz glass of hard liquor (44 mL). General instructions  Schedule regular health, dental, and eye exams.  Stay current with your vaccines.  Tell your health care provider if: ? You often feel depressed. ? You have ever been abused or do not feel safe at home. Summary  Adopting a healthy lifestyle and getting preventive care are important in promoting health and wellness.  Follow your health care provider's instructions about healthy  diet, exercising, and getting tested or screened for diseases.  Follow your health care provider's instructions on monitoring your cholesterol and blood pressure. This information is not intended to replace advice given to you by your health care provider. Make sure you discuss any questions you have with your health care provider. Document Revised: 01/20/2018 Document Reviewed: 01/20/2018 Elsevier Patient Education  2020 Elsevier Inc.  

## 2019-08-12 ENCOUNTER — Telehealth: Payer: Self-pay

## 2019-08-12 LAB — CBC WITH DIFFERENTIAL/PLATELET
Basophils Absolute: 0.1 10*3/uL (ref 0.0–0.2)
Basos: 1 %
EOS (ABSOLUTE): 0.4 10*3/uL (ref 0.0–0.4)
Eos: 5 %
Hematocrit: 44.1 % (ref 34.0–46.6)
Hemoglobin: 14.6 g/dL (ref 11.1–15.9)
Immature Grans (Abs): 0 10*3/uL (ref 0.0–0.1)
Immature Granulocytes: 0 %
Lymphocytes Absolute: 2.1 10*3/uL (ref 0.7–3.1)
Lymphs: 30 %
MCH: 28.9 pg (ref 26.6–33.0)
MCHC: 33.1 g/dL (ref 31.5–35.7)
MCV: 87 fL (ref 79–97)
Monocytes Absolute: 0.6 10*3/uL (ref 0.1–0.9)
Monocytes: 8 %
Neutrophils Absolute: 3.8 10*3/uL (ref 1.4–7.0)
Neutrophils: 56 %
Platelets: 254 10*3/uL (ref 150–450)
RBC: 5.05 x10E6/uL (ref 3.77–5.28)
RDW: 13.2 % (ref 11.7–15.4)
WBC: 6.9 10*3/uL (ref 3.4–10.8)

## 2019-08-12 LAB — COMPREHENSIVE METABOLIC PANEL
ALT: 18 IU/L (ref 0–32)
AST: 21 IU/L (ref 0–40)
Albumin/Globulin Ratio: 2 (ref 1.2–2.2)
Albumin: 4.3 g/dL (ref 3.8–4.8)
Alkaline Phosphatase: 105 IU/L (ref 48–121)
BUN/Creatinine Ratio: 14 (ref 12–28)
BUN: 11 mg/dL (ref 8–27)
Bilirubin Total: 0.4 mg/dL (ref 0.0–1.2)
CO2: 23 mmol/L (ref 20–29)
Calcium: 9.7 mg/dL (ref 8.7–10.3)
Chloride: 104 mmol/L (ref 96–106)
Creatinine, Ser: 0.81 mg/dL (ref 0.57–1.00)
GFR calc Af Amer: 89 mL/min/{1.73_m2} (ref >59)
GFR calc non Af Amer: 78 mL/min/{1.73_m2} (ref >59)
Globulin, Total: 2.2 g/dL (ref 1.5–4.5)
Glucose: 82 mg/dL (ref 65–99)
Potassium: 4.8 mmol/L (ref 3.5–5.2)
Sodium: 141 mmol/L (ref 134–144)
Total Protein: 6.5 g/dL (ref 6.0–8.5)

## 2019-08-12 LAB — HEMOGLOBIN A1C
Est. average glucose Bld gHb Est-mCnc: 108 mg/dL
Hgb A1c MFr Bld: 5.4 % (ref 4.8–5.6)

## 2019-08-12 LAB — TSH: TSH: 1.51 u[IU]/mL (ref 0.450–4.500)

## 2019-08-12 LAB — LIPID PANEL
Chol/HDL Ratio: 3.8 ratio (ref 0.0–4.4)
Cholesterol, Total: 164 mg/dL (ref 100–199)
HDL: 43 mg/dL (ref >39)
LDL Chol Calc (NIH): 95 mg/dL (ref 0–99)
Triglycerides: 146 mg/dL (ref 0–149)
VLDL Cholesterol Cal: 26 mg/dL (ref 5–40)

## 2019-08-12 NOTE — Telephone Encounter (Signed)
LMTCB-if patient calls back ok for PEC nurse to give results °

## 2019-08-12 NOTE — Telephone Encounter (Signed)
Kidney and liver function are normal. Sodium, potassium, and calcium are normal. Cholesterol is normal and much improved compared to last year. Thyroid is normal. Blood count is normal. A1c/sugar is normal.  Written by Mar Daring, PA-C on 08/12/2019 7:23 AM EDT Seen by patient Katherine White on 08/12/2019 9:53 AM

## 2019-08-12 NOTE — Telephone Encounter (Signed)
-----   Message from Mar Daring, [place] sent at 08/12/2019  7:23 AM EDT ----- Kidney and liver function are normal. Sodium, potassium, and calcium are normal. Cholesterol is normal and much improved compared to last year. Thyroid is normal. Blood count is normal. A1c/sugar is normal.

## 2019-08-18 ENCOUNTER — Ambulatory Visit
Admission: RE | Admit: 2019-08-18 | Discharge: 2019-08-18 | Disposition: A | Discharge status: Home / Self Care | Payer: 59 | Source: Ambulatory Visit | Attending: Physician Assistant | Admitting: Physician Assistant

## 2019-08-18 DIAGNOSIS — Z1231 Encounter for screening mammogram for malignant neoplasm of breast: Secondary | ICD-10-CM | POA: Insufficient documentation

## 2019-08-19 ENCOUNTER — Telehealth: Payer: Self-pay

## 2019-08-19 NOTE — Telephone Encounter (Signed)
-----   Message from Mar Daring, [place] sent at 08/18/2019  6:05 PM EDT ----- Normal mammogram. Repeat screening in one year.

## 2019-08-19 NOTE — Telephone Encounter (Signed)
Normal mammogram. Repeat screening in one year.  Written by Mar Daring, PA-C on 08/18/2019 6:05 PM EDT Seen by patient Katherine White on 08/18/2019 8:44 PM

## 2019-08-19 NOTE — Telephone Encounter (Signed)
Pt. Given results, verbalizes understanding. 

## 2019-08-19 NOTE — Telephone Encounter (Signed)
Tried calling patient. Left message to call back. OK for [facility] triage to advise patient of results.

## 2020-04-18 ENCOUNTER — Encounter: Payer: Self-pay | Admitting: Physician Assistant

## 2020-07-10 ENCOUNTER — Telehealth: Payer: Self-pay

## 2020-07-10 DIAGNOSIS — Z1239 Encounter for other screening for malignant neoplasm of breast: Secondary | ICD-10-CM

## 2020-07-10 NOTE — Telephone Encounter (Signed)
Copied from [place] [id number]. Topic: Referral - Request for Referral >> Jul 10, 2020  9:31 AM Yvette Rack wrote: Has patient seen PCP for this complaint? yes  *If NO, is insurance requiring patient see PCP for this issue before PCP can refer them? Referral for which specialty: Mammogram Preferred provider/office: [facility] Reason for referral: pt requesting referral for mammogram

## 2020-07-10 NOTE — Telephone Encounter (Signed)
Ordered and patient advised.

## 2020-08-16 ENCOUNTER — Encounter: Payer: Self-pay | Admitting: Physician Assistant

## 2020-08-20 ENCOUNTER — Other Ambulatory Visit: Payer: Self-pay

## 2020-08-20 ENCOUNTER — Ambulatory Visit
Admission: RE | Admit: 2020-08-20 | Discharge: 2020-08-20 | Disposition: A | Discharge status: Home / Self Care | Payer: 59 | Source: Ambulatory Visit | Attending: Family Medicine | Admitting: Family Medicine

## 2020-08-20 DIAGNOSIS — Z1231 Encounter for screening mammogram for malignant neoplasm of breast: Secondary | ICD-10-CM | POA: Diagnosis not present

## 2020-11-23 LAB — HEPATIC FUNCTION PANEL
ALT: 32 U/L (ref 7–35)
AST: 15 (ref 13–35)

## 2020-11-23 LAB — BASIC METABOLIC PANEL
Creatinine: 0.7 (ref 0.5–1.1)
Glucose: 106

## 2020-11-23 LAB — COMPREHENSIVE METABOLIC PANEL: eGFR: 91

## 2020-11-23 LAB — LIPID PANEL
Cholesterol: 106 (ref 0–200)
HDL: 46 (ref 35–70)
LDL Cholesterol: 75
Triglycerides: 107 (ref 40–160)

## 2021-02-19 ENCOUNTER — Encounter: Payer: Self-pay | Admitting: Family Medicine

## 2021-03-14 ENCOUNTER — Encounter: Payer: Self-pay | Admitting: Family Medicine

## 2021-06-20 NOTE — Progress Notes (Signed)
? ? ?I,Katherine White,acting as a scribe for Katherine Paganini, MD.,have documented all relevant documentation on the behalf of Katherine Paganini, MD,as directed by  Katherine Paganini, MD while in the presence of Katherine Paganini, MD.  ? ?Medicare Initial Preventative Physical Exam ? ? ? ?Patient: Katherine White, Female    DOB: 15-Nov-1955, 66 y.o.   MRN: [id number] ?Visit Date: 06/21/2021 ? ?Today's Provider: Lavon Paganini, MD  ? ?Chief Complaint  ?Patient presents with  ? Medicare Wellness  ? Rib Injury  ? ?Subjective  ?  ?Medicare Initial Preventative Physical Exam ?Katherine White is a 66 y.o. female who presents today for her Initial Preventative Physical Exam.  ? ?HPI ? ? ?Social History  ? ?Socioeconomic History  ? Marital status: Married  ?  Spouse name: Katherine White  ? Number of children: 4  ? Years of education: some college  ? Highest education level: Not on file  ?Occupational History  ?  Employer: RETIRED  ?Tobacco Use  ? Smoking status: Former  ?  Years: 1.00  ?  Types: Cigarettes  ? Smokeless tobacco: Never  ?Vaping Use  ? Vaping Use: Never used  ?Substance and Sexual Activity  ? Alcohol use: Yes  ?  Alcohol/week: 0.0 standard drinks  ?  Comment: OCCASIONALLY  ? Drug use: No  ? Sexual activity: Yes  ?Other Topics Concern  ? Not on file  ?Social History Narrative  ? Not on file  ? ?Social Determinants of Health  ? ?Financial Resource Strain: Not on file  ?Food Insecurity: Not on file  ?Transportation Needs: Not on file  ?Physical Activity: Not on file  ?Stress: Not on file  ?Social Connections: Not on file  ?Intimate Partner Violence: Not on file  ? ? ?Past Medical History:  ?Diagnosis Date  ? Hypertension   ?  ? ?Patient Active Problem List  ? Diagnosis Date Noted  ? Family history of breast cancer in mother 06/05/2016  ? Personal history of other diseases of the circulatory system 11/17/2014  ? Atypical chest pain 11/17/2014  ? Angina pectoris ([place]) 11/17/2014  ? Takotsubo  cardiomyopathy 11/17/2014  ? Trouble in sleeping 11/17/2014  ? Abortion, spontaneous 08/09/2014  ? Arthritis 08/09/2014  ? Arteriosclerosis of coronary artery 08/09/2014  ? Alopecia 08/09/2014  ? BP (high blood pressure) 08/09/2014  ? Adiposity 08/09/2014  ? Hypercholesterolemia without hypertriglyceridemia 08/09/2014  ? Avitaminosis D 08/09/2014  ? Myocardial infarction ([place]) 01/16/2010  ? ? ?Past Surgical History:  ?Procedure Laterality Date  ? CARDIAC CATHETERIZATION  2011  ? COLONOSCOPY WITH PROPOFOL N/A 08/21/2016  ? Procedure: COLONOSCOPY WITH PROPOFOL;  Surgeon: Jonathon Bellows, MD;  Location: [facility] ENDOSCOPY;  Service: Endoscopy;  Laterality: N/A;  ? [place]  ? AFTER 2 MISCARRIAGES  ? LEEP  1999  ? TONSILLECTOMY    ? [place]  ? ? ?Her family history includes Bladder Cancer in her father; Breast cancer in her cousin, maternal aunt, and another family member; Breast cancer (age of onset: 14) in her mother; Coronary artery disease in her mother; Diabetes in her maternal grandmother, paternal uncle, and another family member; Heart attack in her maternal grandfather; Heart disease in her father, mother, and another family member; Hyperlipidemia in an other family member; Hypertension in her brother; Liver cancer in her mother; Pneumonia in her paternal grandmother. ? ? ?Current Outpatient Medications:  ?  albuterol (PROVENTIL HFA;VENTOLIN HFA) 108 (90 Base) MCG/ACT inhaler, Inhale 1-2 puffs  every 4 (four) hours as needed into the lungs for wheezing or shortness of breath., Disp: 1 Inhaler, Rfl: 0 ?  aspirin 81 MG tablet, Take 1 tablet by mouth daily., Disp: , Rfl:  ?  carvedilol (COREG) 3.125 MG tablet, Take 1 tablet by mouth daily., Disp: , Rfl:  ?  Cholecalciferol (VITAMIN D) 2000 UNITS CAPS, Take 1 capsule by mouth daily., Disp: , Rfl:  ?  fluticasone (FLONASE) 50 MCG/ACT nasal spray, Place 2 sprays into both nostrils daily., Disp: 16 g, Rfl: 3 ?   Ivermectin 1 % CREA, Apply topically once daily., Disp: , Rfl:  ?  loratadine (CLARITIN REDITABS) 10 MG dissolvable tablet, Take 1 tablet by mouth daily., Disp: , Rfl:  ?  losartan (COZAAR) 25 MG tablet, Take 1 tablet by mouth daily., Disp: , Rfl:  ?  Magnesium Citrate 100 MG TABS, Take 1 tablet by mouth daily. Reported on 06/05/2015, Disp: , Rfl:  ?  MULTIPLE VITAMIN PO, Take 1 tablet by mouth daily., Disp: , Rfl:  ?  nitroGLYCERIN (NITROSTAT) 0.4 MG SL tablet, Place 1 tablet under the tongue as needed. Reported on 06/05/2015, Disp: , Rfl:  ?  pyridoxine (B-6) 100 MG tablet, Take 1 tablet by mouth daily., Disp: , Rfl:  ?  simvastatin (ZOCOR) 40 MG tablet, TAKE ONE-HALF TABLET DAILY, Disp: 30 tablet, Rfl: 5 ?  tetrahydrozoline-zinc (VISINE-AC) 0.05-0.25 % ophthalmic solution, Apply 1 drop to eye daily., Disp: , Rfl:   ? ?Patient Care Team: ?Virginia Crews, MD as PCP - General (Family Medicine) ?Yolonda Kida, MD as Consulting Physician (Cardiology) ?Dasher, Rayvon Char, MD (Dermatology) ? ?Review of Systems  ?Constitutional:  Negative for chills, fatigue and fever.  ?HENT:  Negative for congestion, ear pain, rhinorrhea, sneezing and sore throat.   ?Eyes: Negative.  Negative for pain and redness.  ?Respiratory:  Negative for cough, shortness of breath and wheezing.   ?Cardiovascular:  Negative for chest pain and leg swelling.  ?Gastrointestinal:  Negative for abdominal pain, blood in stool, constipation, diarrhea and nausea.  ?Endocrine: Negative for polydipsia and polyphagia.  ?Genitourinary: Negative.  Negative for dysuria, flank pain, hematuria, pelvic pain, vaginal bleeding and vaginal discharge.  ?Musculoskeletal:  Negative for arthralgias, back pain, gait problem and joint swelling.  ?Skin:  Negative for rash.  ?Neurological: Negative.  Negative for dizziness, tremors, seizures, weakness, light-headedness, numbness and headaches.  ?Hematological:  Negative for adenopathy.  ?Psychiatric/Behavioral:  Negative.  Negative for behavioral problems, confusion and dysphoric mood. The patient is not nervous/anxious and is not hyperactive.   ? ? ? Objective  ? ?  ?Vitals: BP 113/89 (BP Location: Left Arm, Patient Position: Sitting, Cuff Size: Large)   Pulse 73   Temp 98.4 ?F (36.9 ?C) (Oral)   Resp 14   Ht 5' 0.75" (1.543 m)   Wt 155 lb (70.3 kg)   BMI 29.53 kg/m?  ?Vision Screening  ? Right eye Left eye Both eyes  ?Without correction '20/20 20/15 20/15 '$  ?With correction     ?Comments: Patient saw all colors  ?Physical Exam ?Vitals reviewed.  ?Constitutional:   ?   General: She is not in acute distress. ?   Appearance: Normal appearance. She is well-developed. She is not diaphoretic.  ?HENT:  ?   Head: Normocephalic and atraumatic.  ?   Right Ear: Tympanic membrane, ear canal and external ear normal.  ?   Left Ear: Tympanic membrane, ear canal and external ear normal.  ?   Nose: Nose normal.  ?  Mouth/Throat:  ?   Mouth: Mucous membranes are moist.  ?   Pharynx: Oropharynx is clear. No oropharyngeal exudate.  ?Eyes:  ?   General: No scleral icterus. ?   Conjunctiva/sclera: Conjunctivae normal.  ?   Pupils: Pupils are equal, round, and reactive to light.  ?Neck:  ?   Thyroid: No thyromegaly.  ?Cardiovascular:  ?   Rate and Rhythm: Normal rate and regular rhythm.  ?   Pulses: Normal pulses.  ?   Heart sounds: Normal heart sounds. No murmur heard. ?Pulmonary:  ?   Effort: Pulmonary effort is normal. No respiratory distress.  ?   Breath sounds: Normal breath sounds. No wheezing or rales.  ?Abdominal:  ?   General: There is no distension.  ?   Palpations: Abdomen is soft.  ?   Tenderness: There is no abdominal tenderness.  ?Musculoskeletal:     ?   General: No deformity.  ?   Cervical back: Neck supple.  ?   Right lower leg: No edema.  ?   Left lower leg: No edema.  ?Lymphadenopathy:  ?   Cervical: No cervical adenopathy.  ?Skin: ?   General: Skin is warm and dry.  ?Neurological:  ?   Mental Status: She is alert and  oriented to person, place, and time. Mental status is at baseline.  ?   Gait: Gait normal.  ?Psychiatric:     ?   Mood and Affect: Mood normal.     ?   Behavior: Behavior normal.     ?   Thought Content: Thought content normal.  ?

## 2021-06-21 ENCOUNTER — Ambulatory Visit (INDEPENDENT_AMBULATORY_CARE_PROVIDER_SITE_OTHER): Payer: Medicare Other | Admitting: Family Medicine

## 2021-06-21 ENCOUNTER — Encounter: Payer: Self-pay | Admitting: Family Medicine

## 2021-06-21 VITALS — BP 113/89 | HR 73 | Temp 98.4°F | Resp 14 | Ht 60.75 in | Wt 155.0 lb

## 2021-06-21 DIAGNOSIS — Z23 Encounter for immunization: Secondary | ICD-10-CM | POA: Diagnosis not present

## 2021-06-21 DIAGNOSIS — Z780 Asymptomatic menopausal state: Secondary | ICD-10-CM

## 2021-06-21 DIAGNOSIS — Z0000 Encounter for general adult medical examination without abnormal findings: Secondary | ICD-10-CM | POA: Diagnosis not present

## 2021-06-21 DIAGNOSIS — Z1231 Encounter for screening mammogram for malignant neoplasm of breast: Secondary | ICD-10-CM

## 2021-06-21 DIAGNOSIS — Z1211 Encounter for screening for malignant neoplasm of colon: Secondary | ICD-10-CM | POA: Diagnosis not present

## 2021-06-21 NOTE — Addendum Note (Signed)
Addended by: Randal Buba on: 06/21/2021 12:08 PM ? ? Modules accepted: Orders ? ?

## 2021-06-25 ENCOUNTER — Ambulatory Visit (INDEPENDENT_AMBULATORY_CARE_PROVIDER_SITE_OTHER): Payer: Medicare Other | Admitting: Family Medicine

## 2021-06-25 ENCOUNTER — Ambulatory Visit
Admission: RE | Admit: 2021-06-25 | Discharge: 2021-06-25 | Disposition: A | Discharge status: Home / Self Care | Payer: Medicare Other | Source: Ambulatory Visit | Attending: Family Medicine | Admitting: Family Medicine

## 2021-06-25 ENCOUNTER — Encounter: Payer: Self-pay | Admitting: Family Medicine

## 2021-06-25 VITALS — BP 96/63 | HR 76 | Temp 97.8°F | Resp 16 | Wt 156.0 lb

## 2021-06-25 DIAGNOSIS — R0781 Pleurodynia: Secondary | ICD-10-CM | POA: Diagnosis not present

## 2021-06-25 NOTE — Progress Notes (Signed)
?  ? ?I,Sulibeya S Dimas,acting as a scribe for Lavon Paganini, MD.,have documented all relevant documentation on the behalf of Lavon Paganini, MD,as directed by  Lavon Paganini, MD while in the presence of Lavon Paganini, MD. ? ? ?Established patient visit ? ? ?Patient: Katherine White   DOB: 1955-11-06   66 y.o. Female  MRN: [id number] ?Visit Date: 06/25/2021 ? ?Today's healthcare provider: Lavon Paganini, MD  ? ?Chief Complaint  ?Patient presents with  ? side pain   ? ?Subjective  ?  ?HPI  ?Patient here today C/O right side pain due to a fall 5 weeks ago at home. Patient reports pain is improving. Patient is requesting an x-ray.  ? ?Patient reports having pain in the right ribs. She tripped over her garden fence and her new bra rolled into her ribs. Felt a pop. ? ?Tried ibuprofen 280m prn.  Upped her Vit C for healing.  Used back brace as a binder as pain worsened with reaching, Twisting, bending.  ? ?No SOB, fever, bruising.  No pain with deep breathing - she is focusing on moving and taking deep breaths.   ? ?Medications: ?Outpatient Medications Prior to Visit  ?Medication Sig  ? albuterol (PROVENTIL HFA;VENTOLIN HFA) 108 (90 Base) MCG/ACT inhaler Inhale 1-2 puffs every 4 (four) hours as needed into the lungs for wheezing or shortness of breath.  ? aspirin 81 MG tablet Take 1 tablet by mouth daily.  ? b complex vitamins capsule Take 1 capsule by mouth daily.  ? carvedilol (COREG) 3.125 MG tablet Take 1 tablet by mouth daily.  ? Cholecalciferol (VITAMIN D) 2000 UNITS CAPS Take 1 capsule by mouth daily.  ? fluticasone (FLONASE) 50 MCG/ACT nasal spray Place 2 sprays into both nostrils daily.  ? Ivermectin 1 % CREA Apply topically once daily.  ? loratadine (CLARITIN REDITABS) 10 MG dissolvable tablet Take 1 tablet by mouth as needed.  ? losartan (COZAAR) 25 MG tablet Take 1 tablet by mouth daily.  ? Magnesium Citrate 100 MG TABS Take 1 tablet by mouth daily. Reported on 06/05/2015  ? MULTIPLE  VITAMIN PO Take 1 tablet by mouth daily.  ? Multiple Vitamins-Minerals (VISION-VITE PRESERVE PO) Take by mouth.  ? nitroGLYCERIN (NITROSTAT) 0.4 MG SL tablet Place 1 tablet under the tongue as needed. Reported on 06/05/2015  ? simvastatin (ZOCOR) 40 MG tablet TAKE ONE-HALF TABLET DAILY  ? tetrahydrozoline-zinc (VISINE-AC) 0.05-0.25 % ophthalmic solution Apply 1 drop to eye daily.  ? [DISCONTINUED] pyridoxine (B-6) 100 MG tablet Take 1 tablet by mouth daily.  ? ?No facility-administered medications prior to visit.  ? ? ?Review of Systems  ?Respiratory:  Negative for cough and shortness of breath.   ?Cardiovascular:  Negative for chest pain.  ?Musculoskeletal:  Positive for myalgias.  ? ?Last CBC ?Lab Results  ?Component Value Date  ? WBC 6.9 08/11/2019  ? HGB 14.6 08/11/2019  ? HCT 44.1 08/11/2019  ? MCV 87 08/11/2019  ? MCH 28.9 08/11/2019  ? RDW 13.2 08/11/2019  ? PLT 254 08/11/2019  ? ?Last metabolic panel ?Lab Results  ?Component Value Date  ? GLUCOSE 82 08/11/2019  ? NA 141 08/11/2019  ? K 4.8 08/11/2019  ? CL 104 08/11/2019  ? CO2 23 08/11/2019  ? BUN 11 08/11/2019  ? CREATININE 0.7 11/23/2020  ? EGFR 91 11/23/2020  ? CALCIUM 9.7 08/11/2019  ? PROT 6.5 08/11/2019  ? ALBUMIN 4.3 08/11/2019  ? LABGLOB 2.2 08/11/2019  ? AGRATIO 2.0 08/11/2019  ? BILITOT 0.4 08/11/2019  ?  ALKPHOS 105 08/11/2019  ? AST 15 11/23/2020  ? ALT 32 11/23/2020  ? ?Last lipids ?Lab Results  ?Component Value Date  ? CHOL 106 11/23/2020  ? HDL 46 11/23/2020  ? [place] 75 11/23/2020  ? TRIG 107 11/23/2020  ? CHOLHDL 3.8 08/11/2019  ? ?  ?  Objective  ?  ?BP 96/63 (BP Location: Left Arm, Patient Position: Sitting, Cuff Size: Large)   Pulse 76   Temp 97.8 ?F (36.6 ?C)   Resp 16   Wt 156 lb (70.8 kg)   BMI 29.72 kg/m?  ?BP Readings from Last 3 Encounters:  ?06/25/21 96/63  ?06/21/21 113/89  ?08/11/19 120/85  ? ?Wt Readings from Last 3 Encounters:  ?06/25/21 156 lb (70.8 kg)  ?06/21/21 155 lb (70.3 kg)  ?08/11/19 160 lb 9.6 oz (72.8 kg)  ? ?   ? ?Physical Exam ?Vitals reviewed.  ?Constitutional:   ?   General: She is not in acute distress. ?   Appearance: Normal appearance. She is well-developed. She is not diaphoretic.  ?HENT:  ?   Head: Normocephalic and atraumatic.  ?Eyes:  ?   General: No scleral icterus. ?   Conjunctiva/sclera: Conjunctivae normal.  ?Neck:  ?   Thyroid: No thyromegaly.  ?Cardiovascular:  ?   Rate and Rhythm: Normal rate and regular rhythm.  ?   Pulses: Normal pulses.  ?   Heart sounds: Normal heart sounds. No murmur heard. ?Pulmonary:  ?   Effort: Pulmonary effort is normal. No respiratory distress.  ?   Breath sounds: Normal breath sounds. No wheezing, rhonchi or rales.  ?   Comments: TTP over R ribs under R breast (mid-clavicular) and R costal margin and costochondral joints on R side of sternum ?Musculoskeletal:  ?   Cervical back: Neck supple.  ?Lymphadenopathy:  ?   Cervical: No cervical adenopathy.  ?Neurological:  ?   Mental Status: She is alert and oriented to person, place, and time. Mental status is at baseline.  ?  ? ? ?No results found for any visits on 06/25/21. ? Assessment & Plan  ?  ? ?1. Rib pain on right side ?- new problem, subacute x5 wks ?- improving, but would like to know if there was a fracture ?- will get XRays ?- continue NSAIDs, rest, IS, ice ?- DG Ribs Unilateral Right; Future  ? ?Return if symptoms worsen or fail to improve.  ?   ? ?I, Lavon Paganini, MD, have reviewed all documentation for this visit. The documentation on 06/25/21 for the exam, diagnosis, procedures, and orders are all accurate and complete. ? ? ?Virginia Crews, MD, MPH ?[place] ?[facility]   ?

## 2021-07-11 ENCOUNTER — Ambulatory Visit
Admission: RE | Admit: 2021-07-11 | Discharge: 2021-07-11 | Disposition: A | Discharge status: Home / Self Care | Payer: Medicare Other | Source: Ambulatory Visit | Attending: Family Medicine | Admitting: Family Medicine

## 2021-07-11 ENCOUNTER — Encounter: Payer: Self-pay | Admitting: Family Medicine

## 2021-07-11 ENCOUNTER — Ambulatory Visit (INDEPENDENT_AMBULATORY_CARE_PROVIDER_SITE_OTHER): Payer: Medicare Other | Admitting: Family Medicine

## 2021-07-11 ENCOUNTER — Ambulatory Visit
Admission: RE | Admit: 2021-07-11 | Discharge: 2021-07-11 | Disposition: A | Discharge status: Home / Self Care | Payer: Medicare Other | Attending: Family Medicine | Admitting: Family Medicine

## 2021-07-11 VITALS — BP 109/77 | HR 83 | Resp 16 | Wt 156.0 lb

## 2021-07-11 DIAGNOSIS — M7989 Other specified soft tissue disorders: Secondary | ICD-10-CM | POA: Insufficient documentation

## 2021-07-11 DIAGNOSIS — R600 Localized edema: Secondary | ICD-10-CM | POA: Diagnosis not present

## 2021-07-11 NOTE — Progress Notes (Signed)
Established patient visit  I,Katherine White,acting as a scribe for Katherine Paganini, MD.,have documented all relevant documentation on the behalf of Katherine Paganini, MD,as directed by  Katherine Paganini, MD while in the presence of Katherine Paganini, MD.   Patient: Katherine White   DOB: August 07, 1955   66 y.o. Female  MRN: [id number] Visit Date: 07/11/2021  Today's healthcare provider: Lavon Paganini, MD   Chief Complaint  Patient presents with   Foot Swelling   Subjective    HPI  Patient has had swelling in right foot for 4 days. No injury mechanism. Patient states she felt a pinch/pain on top of right foot before it began to swell. Patient states her lower leg and ankle is very sore. Swelling has been constant since it began.   Medications: Outpatient Medications Prior to Visit  Medication Sig   albuterol (PROVENTIL HFA;VENTOLIN HFA) 108 (90 Base) MCG/ACT inhaler Inhale 1-2 puffs every 4 (four) hours as needed into the lungs for wheezing or shortness of breath.   aspirin 81 MG tablet Take 1 tablet by mouth daily.   b complex vitamins capsule Take 1 capsule by mouth daily.   carvedilol (COREG) 3.125 MG tablet Take 1 tablet by mouth daily.   Cholecalciferol (VITAMIN D) 2000 UNITS CAPS Take 1 capsule by mouth daily.   fluticasone (FLONASE) 50 MCG/ACT nasal spray Place 2 sprays into both nostrils daily.   Ivermectin 1 % CREA Apply topically once daily.   loratadine (CLARITIN REDITABS) 10 MG dissolvable tablet Take 1 tablet by mouth as needed.   losartan (COZAAR) 25 MG tablet Take 1 tablet by mouth daily.   Magnesium Citrate 100 MG TABS Take 1 tablet by mouth daily. Reported on 06/05/2015   MULTIPLE VITAMIN PO Take 1 tablet by mouth daily.   Multiple Vitamins-Minerals (VISION-VITE PRESERVE PO) Take by mouth.   nitroGLYCERIN (NITROSTAT) 0.4 MG SL tablet Place 1 tablet under the tongue as needed. Reported on 06/05/2015   simvastatin (ZOCOR) 40 MG tablet TAKE ONE-HALF TABLET  DAILY   tetrahydrozoline-zinc (VISINE-AC) 0.05-0.25 % ophthalmic solution Apply 1 drop to eye daily.   No facility-administered medications prior to visit.    Review of Systems  Constitutional:  Negative for appetite change, chills, fatigue and fever.  Respiratory:  Negative for chest tightness and shortness of breath.   Cardiovascular:  Negative for chest pain and palpitations.  Gastrointestinal:  Negative for abdominal pain, nausea and vomiting.  Neurological:  Negative for dizziness and weakness.   Last CBC Lab Results  Component Value Date   WBC 6.9 08/11/2019   HGB 14.6 08/11/2019   HCT 44.1 08/11/2019   MCV 87 08/11/2019   MCH 28.9 08/11/2019   RDW 13.2 08/11/2019   PLT 254 41/66/0630   Last metabolic panel Lab Results  Component Value Date   GLUCOSE 82 08/11/2019   NA 141 08/11/2019   K 4.8 08/11/2019   CL 104 08/11/2019   CO2 23 08/11/2019   BUN 11 08/11/2019   CREATININE 0.7 11/23/2020   EGFR 91 11/23/2020   CALCIUM 9.7 08/11/2019   PROT 6.5 08/11/2019   ALBUMIN 4.3 08/11/2019   LABGLOB 2.2 08/11/2019   AGRATIO 2.0 08/11/2019   BILITOT 0.4 08/11/2019   ALKPHOS 105 08/11/2019   AST 15 11/23/2020   ALT 32 11/23/2020   Last lipids Lab Results  Component Value Date   CHOL 106 11/23/2020   HDL 46 11/23/2020   LDLCALC 75 11/23/2020   TRIG 107 11/23/2020  CHOLHDL 3.8 08/11/2019       Objective    BP 109/77 (BP Location: Left Arm, Patient Position: Sitting, Cuff Size: Normal)   Pulse 83   Resp 16   Wt 156 lb (70.8 kg)   SpO2 98%   BMI 29.72 kg/m  BP Readings from Last 3 Encounters:  07/11/21 109/77  06/25/21 96/63  06/21/21 113/89   Wt Readings from Last 3 Encounters:  07/11/21 156 lb (70.8 kg)  06/25/21 156 lb (70.8 kg)  06/21/21 155 lb (70.3 kg)      Physical Exam Vitals reviewed.  Constitutional:      General: She is not in acute distress.    Appearance: She is well-developed.  HENT:     Head: Normocephalic and atraumatic.  Eyes:      General: No scleral icterus.    Conjunctiva/sclera: Conjunctivae normal.  Cardiovascular:     Rate and Rhythm: Normal rate and regular rhythm.  Pulmonary:     Effort: Pulmonary effort is normal. No respiratory distress.  Musculoskeletal:     Right lower leg: Edema (no swelling of the shin, but non-pitting edema of the right foot and ankle) present.     Left lower leg: No edema.     Comments: No erythema around joints or tenderness to palpation over bony landmarks.  No bite mark noted  Skin:    General: Skin is warm and dry.     Findings: No erythema or rash.  Neurological:     Mental Status: She is alert and oriented to person, place, and time.  Psychiatric:        Behavior: Behavior normal.      No results found for any visits on 07/11/21.  Assessment & Plan     1. Swelling of right foot -New problem x4 days - No known injury mechanism, but will x-ray the foot to ensure no occult fracture - No history of VTE and there is no calf tenderness and Homans is negative, but we should rule out DVT given unilateral swelling - No signs of gout on exam - Further treatment pending x-ray and DVT study results - DG Foot Complete Right; Future - US Venous Img Lower Unilateral Right; Future   Return if symptoms worsen or fail to improve.      I, Katherine Paganini, MD, have reviewed all documentation for this visit. The documentation on 07/11/21 for the exam, diagnosis, procedures, and orders are all accurate and complete.   Katherine White, Dionne Bucy, MD, MPH [facility]

## 2021-09-04 ENCOUNTER — Ambulatory Visit
Admission: RE | Admit: 2021-09-04 | Discharge: 2021-09-04 | Disposition: A | Discharge status: Home / Self Care | Payer: Medicare Other | Source: Ambulatory Visit | Attending: Family Medicine | Admitting: Family Medicine

## 2021-09-04 ENCOUNTER — Other Ambulatory Visit: Payer: Self-pay | Admitting: Family Medicine

## 2021-09-04 DIAGNOSIS — Z1231 Encounter for screening mammogram for malignant neoplasm of breast: Secondary | ICD-10-CM | POA: Diagnosis not present

## 2021-09-04 DIAGNOSIS — Z780 Asymptomatic menopausal state: Secondary | ICD-10-CM | POA: Insufficient documentation

## 2021-09-04 DIAGNOSIS — M85832 Other specified disorders of bone density and structure, left forearm: Secondary | ICD-10-CM | POA: Diagnosis not present

## 2021-09-04 DIAGNOSIS — M810 Age-related osteoporosis without current pathological fracture: Secondary | ICD-10-CM | POA: Diagnosis not present

## 2021-09-04 DIAGNOSIS — M85851 Other specified disorders of bone density and structure, right thigh: Secondary | ICD-10-CM | POA: Diagnosis not present

## 2021-09-04 DIAGNOSIS — M816 Localized osteoporosis [Lequesne]: Secondary | ICD-10-CM

## 2021-09-04 MED ORDER — ALENDRONATE SODIUM 70 MG PO TABS: 70.0000 mg | ORAL_TABLET | ORAL | 11 refills | Status: DC

## 2021-09-04 NOTE — Progress Notes (Signed)
Osteoporosis seen on DEXA, bone density scan. Recommend use of Vitamin D and Calcium, in addition to wear bearing exercise. If agreeable, recommend use of prescription bisphosphonate ex fosamax to assist with bone growth.  Gwyneth Sprout, [address] [place], [place] [address] [phone] (phone) [phone] (fax) [place]

## 2021-09-04 NOTE — Progress Notes (Signed)
Pt informed and verbalized understanding. Pt would like fosamax sent to [facility].  KP

## 2021-09-05 NOTE — Progress Notes (Signed)
Hi Katherine White  Normal mammogram; repeat in 1 year.  Please let us know if you have any questions.  Thank you,  Arrionna Serena, FNP 

## 2021-11-05 DIAGNOSIS — R0609 Other forms of dyspnea: Secondary | ICD-10-CM | POA: Diagnosis not present

## 2021-11-05 DIAGNOSIS — R0789 Other chest pain: Secondary | ICD-10-CM | POA: Diagnosis not present

## 2021-11-05 DIAGNOSIS — E782 Mixed hyperlipidemia: Secondary | ICD-10-CM | POA: Diagnosis not present

## 2021-11-05 DIAGNOSIS — I2510 Atherosclerotic heart disease of native coronary artery without angina pectoris: Secondary | ICD-10-CM | POA: Diagnosis not present

## 2021-11-08 ENCOUNTER — Other Ambulatory Visit: Payer: Self-pay | Admitting: Internal Medicine

## 2021-11-08 DIAGNOSIS — I209 Angina pectoris, unspecified: Secondary | ICD-10-CM

## 2021-11-20 ENCOUNTER — Encounter (HOSPITAL_COMMUNITY): Payer: Self-pay

## 2021-11-20 ENCOUNTER — Telehealth (HOSPITAL_COMMUNITY): Payer: Self-pay | Admitting: *Deleted

## 2021-11-20 MED ORDER — IVABRADINE HCL 5 MG PO TABS: ORAL_TABLET | ORAL | 0 refills | Status: DC

## 2021-11-20 MED ORDER — METOPROLOL TARTRATE 100 MG PO TABS: ORAL_TABLET | ORAL | 0 refills | Status: DC

## 2021-11-20 NOTE — Telephone Encounter (Signed)
Reaching out to patient to offer assistance regarding upcoming cardiac imaging study; pt verbalizes understanding of appt date/time, parking situation and where to check in, medications ordered, and verified current allergies; name and call back number provided for further questions should they arise  Gordy Clement RN Navigator Cardiac Imaging [facility] [phone] office [phone] cell  Patient to hold her BP medications and take '100mg'$  metoprolol tartrate and '15mg'$  ivabradine two hours prior to her cardiac CT scan.

## 2021-11-20 NOTE — Telephone Encounter (Signed)
Attempted to call patient regarding upcoming cardiac CT appointment. °Left message on voicemail with name and callback number ° °Adrina Armijo RN Navigator Cardiac Imaging °[facility] °[phone] Office °[phone] Cell ° °

## 2021-11-21 ENCOUNTER — Ambulatory Visit
Admission: RE | Admit: 2021-11-21 | Discharge: 2021-11-21 | Disposition: A | Discharge status: Home / Self Care | Payer: Medicare Other | Source: Ambulatory Visit | Attending: Internal Medicine | Admitting: Internal Medicine

## 2021-11-21 DIAGNOSIS — I209 Angina pectoris, unspecified: Secondary | ICD-10-CM | POA: Diagnosis not present

## 2021-11-21 LAB — POCT I-STAT CREATININE: Creatinine, Ser: 0.7 mg/dL (ref 0.44–1.00)

## 2021-11-21 MED ORDER — NITROGLYCERIN 0.4 MG SL SUBL
0.8000 mg | SUBLINGUAL_TABLET | Freq: Once | SUBLINGUAL | Status: AC
  Administered 2021-11-21: 0.8 mg via SUBLINGUAL

## 2021-11-21 MED ORDER — IOHEXOL 350 MG/ML SOLN
75.0000 mL | Freq: Once | INTRAVENOUS | Status: AC | PRN
  Administered 2021-11-21: 75 mL via INTRAVENOUS

## 2021-11-21 NOTE — Progress Notes (Signed)
Patient tolerated CT well. Drank coffee after. Vital signs stable encourage to drink water throughout day.Reasons explained and verbalized understanding. Ambulated steady gait.

## 2021-12-19 ENCOUNTER — Ambulatory Visit (INDEPENDENT_AMBULATORY_CARE_PROVIDER_SITE_OTHER): Payer: Medicare Other | Admitting: Physician Assistant

## 2021-12-19 ENCOUNTER — Encounter: Payer: Self-pay | Admitting: Physician Assistant

## 2021-12-19 VITALS — BP 116/69 | HR 88 | Temp 99.3°F | Resp 16 | Wt 158.0 lb

## 2021-12-19 DIAGNOSIS — M25512 Pain in left shoulder: Secondary | ICD-10-CM | POA: Diagnosis not present

## 2021-12-19 DIAGNOSIS — J019 Acute sinusitis, unspecified: Secondary | ICD-10-CM | POA: Diagnosis not present

## 2021-12-19 LAB — POCT INFLUENZA A/B
Influenza A, POC: NEGATIVE
Influenza B, POC: NEGATIVE

## 2021-12-19 LAB — POC COVID19 BINAXNOW: SARS Coronavirus 2 Ag: NEGATIVE

## 2021-12-19 NOTE — Progress Notes (Signed)
I,[name],acting as a Education administrator for [employer], PA-C.,have documented all relevant documentation on the behalf of Mikey Kirschner, PA-C,as directed by  Mikey Kirschner, PA-C while in the presence of Mikey Kirschner, PA-C.   Established patient visit   Patient: Katherine White   DOB: 05-17-1955   66 y.o. Female  MRN: [id number] Visit Date: 12/19/2021  Today's healthcare provider: Mikey Kirschner, PA-C   Chief Complaint  Patient presents with   Sinusitis   Subjective    HPI  Patient has had sinus congestion and sore throat since yesterday. Patient also having headache; ear pain, and body aches. No fever. Patient did not check for covid at home. Reports sick contact in [place]-- ear infection.  She also reports new left shoulder pain, unable to extend 2/2 pain. Would like ortho referral. Denies injury.  Medications: Outpatient Medications Prior to Visit  Medication Sig   albuterol (PROVENTIL HFA;VENTOLIN HFA) 108 (90 Base) MCG/ACT inhaler Inhale 1-2 puffs every 4 (four) hours as needed into the lungs for wheezing or shortness of breath.   alendronate (FOSAMAX) 70 MG tablet Take 1 tablet (70 mg total) by mouth every 7 (seven) days. Take with a full glass of water on an empty stomach. Remain in seated/upright position for 30 mins.   aspirin 81 MG tablet Take 1 tablet by mouth daily.   b complex vitamins capsule Take 1 capsule by mouth daily.   carvedilol (COREG) 3.125 MG tablet Take 1 tablet by mouth daily.   Cholecalciferol (VITAMIN D) 2000 UNITS CAPS Take 1 capsule by mouth daily.   fluticasone (FLONASE) 50 MCG/ACT nasal spray Place 2 sprays into both nostrils daily.   ivabradine (CORLANOR) 5 MG TABS tablet Take tablets ('15mg'$ ) TWO hours prior to your cardiac CT scan.   Ivermectin 1 % CREA Apply topically once daily.   loratadine (CLARITIN REDITABS) 10 MG dissolvable tablet Take 1 tablet by mouth as needed.   losartan (COZAAR) 25 MG tablet Take 1 tablet by mouth daily.    Magnesium Citrate 100 MG TABS Take 1 tablet by mouth daily. Reported on 06/05/2015   metoprolol tartrate (LOPRESSOR) 100 MG tablet Take tablet ('100mg'$ ) TWO hours prior to your Cardiac CT scan. Do not take your Carvedilol.   MULTIPLE VITAMIN PO Take 1 tablet by mouth daily.   Multiple Vitamins-Minerals (VISION-VITE PRESERVE PO) Take by mouth.   nitroGLYCERIN (NITROSTAT) 0.4 MG SL tablet Place 1 tablet under the tongue as needed. Reported on 06/05/2015   simvastatin (ZOCOR) 40 MG tablet TAKE ONE-HALF TABLET DAILY   tetrahydrozoline-zinc (VISINE-AC) 0.05-0.25 % ophthalmic solution Apply 1 drop to eye daily.   No facility-administered medications prior to visit.    Review of Systems  Constitutional:  Negative for appetite change, chills, fatigue and fever.  HENT:  Positive for congestion and ear pain.   Respiratory:  Positive for cough. Negative for chest tightness and shortness of breath.   Cardiovascular:  Negative for chest pain and palpitations.  Gastrointestinal:  Negative for abdominal pain, nausea and vomiting.  Neurological:  Negative for dizziness and weakness.     Objective    BP 116/69 (BP Location: Right Arm, Patient Position: Sitting, Cuff Size: Normal)   Pulse 88   Temp 99.3 F (37.4 C) (Oral)   Resp 16   Wt 158 lb (71.7 kg)   SpO2 99%   BMI 30.10 kg/m   Physical Exam Constitutional:      General: She is awake.     Appearance: She is  well-developed.  HENT:     Head: Normocephalic.     Right Ear: Tympanic membrane normal.     Left Ear: Tympanic membrane normal.     Nose: Congestion and rhinorrhea present.     Mouth/Throat:     Pharynx: Posterior oropharyngeal erythema present. No oropharyngeal exudate.  Eyes:     Conjunctiva/sclera: Conjunctivae normal.  Cardiovascular:     Rate and Rhythm: Normal rate and regular rhythm.     Heart sounds: Normal heart sounds.  Pulmonary:     Effort: Pulmonary effort is normal.     Breath sounds: Normal breath sounds.   Skin:    General: Skin is warm.  Neurological:     Mental Status: She is alert and oriented to person, place, and time.  Psychiatric:        Attention and Perception: Attention normal.        Mood and Affect: Mood normal.        Speech: Speech normal.        Behavior: Behavior is cooperative.     No results found for any visits on 12/19/21.  Assessment & Plan     Acute pharyngitis Negative flu, covid poc.  Advised tylenol, cough drops, corcidin cold and flu otc. Increase frluids.  2. Left shoulder pain  Referred to ortho  Return if symptoms worsen or fail to improve.      I, Mikey Kirschner, PA-C have reviewed all documentation for this visit. The documentation on  12/19/2021  for the exam, diagnosis, procedures, and orders are all accurate and complete.  Mikey Kirschner, PA-C [facility] [address] [place], [place], [address] Office: [phone] Fax: [place]

## 2021-12-23 ENCOUNTER — Other Ambulatory Visit: Payer: Self-pay

## 2021-12-23 ENCOUNTER — Ambulatory Visit: Payer: Self-pay

## 2021-12-23 MED ORDER — AMOXICILLIN-POT CLAVULANATE 875-125 MG PO TABS: 1.0000 | ORAL_TABLET | Freq: Two times a day (BID) | ORAL | 0 refills | Status: AC

## 2021-12-23 NOTE — Telephone Encounter (Signed)
Patient called, left VM to return the call to the office to speal to a nurse.Unable to reach patient after 3 attempts by [employer] NT, routing to the provider for resolution per protocol.   Summary: congestion, pain in ears, throat, and a headache, as well as mucus/Requesting Rx   Pt was seen on 12/19/2021 by PA Ria Comment pt stated she is having a lot of congestion and needs an antibiotic or a z-pack. Pt stated she was not given anything at the time of her appointment. Pt stated she has pain in her ears, throat, and a headache, as well as mucus.  Pt seeking clinical advice.

## 2021-12-23 NOTE — Telephone Encounter (Signed)
Ok to send in Augmentin 1 tab BID x7 d #14 r0

## 2021-12-23 NOTE — Telephone Encounter (Signed)
2nd attempt, Patient called, left VM to return the call to the office to discuss symptoms with a nurse.   Summary: congestion, pain in ears, throat, and a headache, as well as mucus/Requesting Rx   Pt was seen on 12/19/2021 by PA Ria Comment pt stated she is having a lot of congestion and needs an antibiotic or a z-pack. Pt stated she was not given anything at the time of her appointment. Pt stated she has pain in her ears, throat, and a headache, as well as mucus.  Pt seeking clinical advice.

## 2021-12-23 NOTE — Telephone Encounter (Signed)
  Chief Complaint: Patient states she was in last week with sinus symptoms. Patient states he r symptoms are not better and she has been very uncomfortable over the weekend- requesting antibiotic Symptoms: sore throat, sinus drainage, ear pain, low grade temp  Frequency: in office 12/19/21 Pertinent Negatives: Patient denies SOB Disposition: '[]'$ ED /'[]'$ Urgent Care (no appt availability in office) / '[]'$ Appointment(In office/virtual)/ '[]'$  [facility]/ '[]'$ Home Care/ '[]'$ Refused Recommended Disposition /'[]'$ [facility]/ '[x]'$  Follow-up with PCP Additional Notes: Patient requesting antibiotic- [facility]  Reason for Disposition . Earache  Answer Assessment - Initial Assessment Questions 1. LOCATION: "Where does it hurt?"      *No Answer* 2. ONSET: "When did the sinus pain start?"  (e.g., hours, days)      Pain and pressure in ears, sinus pressure 3. SEVERITY: "How bad is the pain?"   (Scale 1-10; mild, moderate or severe)   - MILD (1-3): doesn't interfere with normal activities    - MODERATE (4-7): interferes with normal activities (e.g., work or school) or awakens from sleep   - SEVERE (8-10): excruciating pain and patient unable to do any normal activities        Patient reports symptoms have gotten worse over the weekend 4. RECURRENT SYMPTOM: "Have you ever had sinus problems before?" If Yes, ask: "When was the last time?" and "What happened that time?"      Yes- patient feels she needs antibiotic 5. NASAL CONGESTION: "Is the nose blocked?" If Yes, ask: "Can you open it or must you breathe through your mouth?"     Blocked at times- a lot of drainage 6. NASAL DISCHARGE: "Do you have discharge from your nose?" If so ask, "What color?"     Coughing- white and greenish clumps 7. FEVER: "Do you have a fever?" If Yes, ask: "What is it, how was it measured, and when did it start?"      Low grade temp-99 8. OTHER SYMPTOMS: "Do you have any other symptoms?" (e.g., sore throat,  cough, earache, difficulty breathing)     Sore throat, earache, cough 9. PREGNANCY: "Is there any chance you are pregnant?" "When was your last menstrual period?"     na  Protocols used: Sinus Pain or Congestion-A-AH

## 2021-12-23 NOTE — Telephone Encounter (Signed)
Patient called, left VM to return the call to the office to discuss symptoms with a nurse.  Summary: congestion, pain in ears, throat, and a headache, as well as mucus/Requesting Rx   Pt was seen on 12/19/2021 by PA Ria Comment pt stated she is having a lot of congestion and needs an antibiotic or a z-pack. Pt stated she was not given anything at the time of her appointment. Pt stated she has pain in her ears, throat, and a headache, as well as mucus.  Pt seeking clinical advice.

## 2021-12-25 DIAGNOSIS — M7592 Shoulder lesion, unspecified, left shoulder: Secondary | ICD-10-CM | POA: Diagnosis not present

## 2021-12-31 DIAGNOSIS — Z08 Encounter for follow-up examination after completed treatment for malignant neoplasm: Secondary | ICD-10-CM | POA: Diagnosis not present

## 2021-12-31 DIAGNOSIS — D0439 Carcinoma in situ of skin of other parts of face: Secondary | ICD-10-CM | POA: Diagnosis not present

## 2021-12-31 DIAGNOSIS — D2371 Other benign neoplasm of skin of right lower limb, including hip: Secondary | ICD-10-CM | POA: Diagnosis not present

## 2021-12-31 DIAGNOSIS — L821 Other seborrheic keratosis: Secondary | ICD-10-CM | POA: Diagnosis not present

## 2021-12-31 DIAGNOSIS — Z85828 Personal history of other malignant neoplasm of skin: Secondary | ICD-10-CM | POA: Diagnosis not present

## 2022-01-08 DIAGNOSIS — M79672 Pain in left foot: Secondary | ICD-10-CM | POA: Diagnosis not present

## 2022-01-08 DIAGNOSIS — M7732 Calcaneal spur, left foot: Secondary | ICD-10-CM | POA: Diagnosis not present

## 2022-01-08 DIAGNOSIS — M722 Plantar fascial fibromatosis: Secondary | ICD-10-CM | POA: Diagnosis not present

## 2022-01-08 DIAGNOSIS — M216X1 Other acquired deformities of right foot: Secondary | ICD-10-CM | POA: Diagnosis not present

## 2022-01-29 DIAGNOSIS — M7732 Calcaneal spur, left foot: Secondary | ICD-10-CM | POA: Diagnosis not present

## 2022-01-29 DIAGNOSIS — M79672 Pain in left foot: Secondary | ICD-10-CM | POA: Diagnosis not present

## 2022-01-29 DIAGNOSIS — M216X1 Other acquired deformities of right foot: Secondary | ICD-10-CM | POA: Diagnosis not present

## 2022-01-29 DIAGNOSIS — M722 Plantar fascial fibromatosis: Secondary | ICD-10-CM | POA: Diagnosis not present

## 2022-02-19 DIAGNOSIS — L905 Scar conditions and fibrosis of skin: Secondary | ICD-10-CM | POA: Diagnosis not present

## 2022-02-19 DIAGNOSIS — D2339 Other benign neoplasm of skin of other parts of face: Secondary | ICD-10-CM | POA: Diagnosis not present

## 2022-02-19 DIAGNOSIS — D0439 Carcinoma in situ of skin of other parts of face: Secondary | ICD-10-CM | POA: Diagnosis not present

## 2022-03-13 DIAGNOSIS — K080 Exfoliation of teeth due to systemic causes: Secondary | ICD-10-CM | POA: Diagnosis not present

## 2022-05-06 ENCOUNTER — Telehealth: Payer: Self-pay | Admitting: Family Medicine

## 2022-05-06 NOTE — Telephone Encounter (Signed)
Contacted [facility] to schedule their annual wellness visit. Appointment made for 07/01/2022.  [place] Direct Dial: [phone]

## 2022-06-10 DIAGNOSIS — H524 Presbyopia: Secondary | ICD-10-CM | POA: Diagnosis not present

## 2022-06-24 ENCOUNTER — Encounter: Payer: Medicare Other | Admitting: Family Medicine

## 2022-07-01 ENCOUNTER — Telehealth: Payer: Self-pay

## 2022-07-01 ENCOUNTER — Ambulatory Visit (INDEPENDENT_AMBULATORY_CARE_PROVIDER_SITE_OTHER): Payer: Medicare Other

## 2022-07-01 VITALS — Ht 60.5 in | Wt 155.0 lb

## 2022-07-01 DIAGNOSIS — Z1211 Encounter for screening for malignant neoplasm of colon: Secondary | ICD-10-CM

## 2022-07-01 DIAGNOSIS — Z0000 Encounter for general adult medical examination without abnormal findings: Secondary | ICD-10-CM

## 2022-07-01 DIAGNOSIS — Z1231 Encounter for screening mammogram for malignant neoplasm of breast: Secondary | ICD-10-CM

## 2022-07-01 NOTE — Patient Instructions (Signed)
Katherine White , Thank you for taking time to come for your Medicare Wellness Visit. I appreciate your ongoing commitment to your health goals. Please review the following plan we discussed and let me know if I can assist you in the future.   These are the goals we discussed:  Goals   None     This is a list of the screening recommended for you and due dates:  Health Maintenance  Topic Date Due   Colon Cancer Screening  08/21/2021   COVID-19 Vaccine (3 - 2023-24 season) 10/11/2021   Mammogram  09/05/2022   Flu Shot  09/11/2022   Medicare Annual Wellness Visit  07/01/2023   DTaP/Tdap/Td vaccine (3 - Td or Tdap) 12/30/2023   Pneumonia Vaccine  Completed   DEXA scan (bone density measurement)  Completed   Hepatitis C Screening: USPSTF Recommendation to screen - Ages 54-79 yo.  Completed   Zoster (Shingles) Vaccine  Completed   HPV Vaccine  Aged Out    Advanced directives: yes  Conditions/risks identified: none  Next appointment: Follow up in one year for your annual wellness visit 07/07/2023 @9 :15am telephone   Preventive Care 65 Years and Older, Female Preventive care refers to lifestyle choices and visits with your health care provider that can promote health and wellness. What does preventive care include? A yearly physical exam. This is also called an annual well check. Dental exams once or twice a year. Routine eye exams. Ask your health care provider how often you should have your eyes checked. Personal lifestyle choices, including: Daily care of your teeth and gums. Regular physical activity. Eating a healthy diet. Avoiding tobacco and drug use. Limiting alcohol use. Practicing safe sex. Taking low-dose aspirin every day. Taking vitamin and mineral supplements as recommended by your health care provider. What happens during an annual well check? The services and screenings done by your health care provider during your annual well check will depend on your age, overall  health, lifestyle risk factors, and family history of disease. Counseling  Your health care provider may ask you questions about your: Alcohol use. Tobacco use. Drug use. Emotional well-being. Home and relationship well-being. Sexual activity. Eating habits. History of falls. Memory and ability to understand (cognition). Work and work Astronomer. Reproductive health. Screening  You may have the following tests or measurements: Height, weight, and BMI. Blood pressure. Lipid and cholesterol levels. These may be checked every 5 years, or more frequently if you are over 7 years old. Skin check. Lung cancer screening. You may have this screening every year starting at age 43 if you have a 30-pack-year history of smoking and currently smoke or have quit within the past 15 years. Fecal occult blood test (FOBT) of the stool. You may have this test every year starting at age 77. Flexible sigmoidoscopy or colonoscopy. You may have a sigmoidoscopy every 5 years or a colonoscopy every 10 years starting at age 50. Hepatitis C blood test. Hepatitis B blood test. Sexually transmitted disease (STD) testing. Diabetes screening. This is done by checking your blood sugar (glucose) after you have not eaten for a while (fasting). You may have this done every 1-3 years. Bone density scan. This is done to screen for osteoporosis. You may have this done starting at age 19. Mammogram. This may be done every 1-2 years. Talk to your health care provider about how often you should have regular mammograms. Talk with your health care provider about your test results, treatment options, and if necessary,  the need for more tests. Vaccines  Your health care provider may recommend certain vaccines, such as: Influenza vaccine. This is recommended every year. Tetanus, diphtheria, and acellular pertussis (Tdap, Td) vaccine. You may need a Td booster every 10 years. Zoster vaccine. You may need this after age  1. Pneumococcal 13-valent conjugate (PCV13) vaccine. One dose is recommended after age 26. Pneumococcal polysaccharide (PPSV23) vaccine. One dose is recommended after age 56. Talk to your health care provider about which screenings and vaccines you need and how often you need them. This information is not intended to replace advice given to you by your health care provider. Make sure you discuss any questions you have with your health care provider. Document Released: 02/23/2015 Document Revised: 10/17/2015 Document Reviewed: 11/28/2014 Elsevier Interactive Patient Education  2017 [employer].  Fall Prevention in the Home Falls can cause injuries. They can happen to people of all ages. There are many things you can do to make your home safe and to help prevent falls. What can I do on the outside of my home? Regularly fix the edges of walkways and driveways and fix any cracks. Remove anything that might make you trip as you walk through a door, such as a raised step or threshold. Trim any bushes or trees on the path to your home. Use bright outdoor lighting. Clear any walking paths of anything that might make someone trip, such as rocks or tools. Regularly check to see if handrails are loose or broken. Make sure that both sides of any steps have handrails. Any raised decks and porches should have guardrails on the edges. Have any leaves, snow, or ice cleared regularly. Use sand or salt on walking paths during winter. Clean up any spills in your garage right away. This includes oil or grease spills. What can I do in the bathroom? Use night lights. Install grab bars by the toilet and in the tub and shower. Do not use towel bars as grab bars. Use non-skid mats or decals in the tub or shower. If you need to sit down in the shower, use a plastic, non-slip stool. Keep the floor dry. Clean up any water that spills on the floor as soon as it happens. Remove soap buildup in the tub or shower  regularly. Attach bath mats securely with double-sided non-slip rug tape. Do not have throw rugs and other things on the floor that can make you trip. What can I do in the bedroom? Use night lights. Make sure that you have a light by your bed that is easy to reach. Do not use any sheets or blankets that are too big for your bed. They should not hang down onto the floor. Have a firm chair that has side arms. You can use this for support while you get dressed. Do not have throw rugs and other things on the floor that can make you trip. What can I do in the kitchen? Clean up any spills right away. Avoid walking on wet floors. Keep items that you use a lot in easy-to-reach places. If you need to reach something above you, use a strong step stool that has a grab bar. Keep electrical cords out of the way. Do not use floor polish or wax that makes floors slippery. If you must use wax, use non-skid floor wax. Do not have throw rugs and other things on the floor that can make you trip. What can I do with my stairs? Do not leave any items on  the stairs. Make sure that there are handrails on both sides of the stairs and use them. Fix handrails that are broken or loose. Make sure that handrails are as long as the stairways. Check any carpeting to make sure that it is firmly attached to the stairs. Fix any carpet that is loose or worn. Avoid having throw rugs at the top or bottom of the stairs. If you do have throw rugs, attach them to the floor with carpet tape. Make sure that you have a light switch at the top of the stairs and the bottom of the stairs. If you do not have them, ask someone to add them for you. What else can I do to help prevent falls? Wear shoes that: Do not have high heels. Have rubber bottoms. Are comfortable and fit you well. Are closed at the toe. Do not wear sandals. If you use a stepladder: Make sure that it is fully opened. Do not climb a closed stepladder. Make sure that  both sides of the stepladder are locked into place. Ask someone to hold it for you, if possible. Clearly mark and make sure that you can see: Any grab bars or handrails. First and last steps. Where the edge of each step is. Use tools that help you move around (mobility aids) if they are needed. These include: Canes. Walkers. Scooters. Crutches. Turn on the lights when you go into a dark area. Replace any light bulbs as soon as they burn out. Set up your furniture so you have a clear path. Avoid moving your furniture around. If any of your floors are uneven, fix them. If there are any pets around you, be aware of where they are. Review your medicines with your doctor. Some medicines can make you feel dizzy. This can increase your chance of falling. Ask your doctor what other things that you can do to help prevent falls. This information is not intended to replace advice given to you by your health care provider. Make sure you discuss any questions you have with your health care provider. Document Released: 11/23/2008 Document Revised: 07/05/2015 Document Reviewed: 03/03/2014 Elsevier Interactive Patient Education  2017 [employer].

## 2022-07-01 NOTE — Addendum Note (Signed)
Addended by: Sue Lush on: 07/01/2022 12:47 PM   Modules accepted: Orders

## 2022-07-01 NOTE — Telephone Encounter (Signed)
AWV done.Marland Kitchenable to perform

## 2022-07-01 NOTE — Progress Notes (Signed)
I connected with  Katherine White on 07/01/22 by a audio enabled telemedicine application and verified that I am speaking with the correct person using two identifiers.  Patient Location: Home  Provider Location: Office/Clinic  I discussed the limitations of evaluation and management by telemedicine. The patient expressed understanding and agreed to proceed.  Subjective:   Katherine White is a 67 y.o. female who presents for Medicare Annual (Subsequent) preventive examination.  Review of Systems    Cardiac Risk Factors include: advanced age (>59men, >4 women);hypertension;dyslipidemia;sedentary lifestyle     Objective:    Today's Vitals   07/01/22 0952 07/01/22 0953  Weight: 155 lb (70.3 kg)   Height: 5' 0.5" (1.537 m)   PainSc:  3    Body mass index is 29.77 kg/m.     07/01/2022   10:10 AM 12/06/2016    9:06 AM 08/21/2016    9:26 AM 06/05/2016    9:11 AM 11/17/2014    8:11 AM  Advanced Directives  Does Patient Have a Medical Advance Directive? Yes No No No No  Type of Estate agent of [place];Living will      Would patient like information on creating a medical advance directive?   No - Patient declined      Current Medications (verified) Outpatient Encounter Medications as of 07/01/2022  Medication Sig   albuterol (PROVENTIL HFA;VENTOLIN HFA) 108 (90 Base) MCG/ACT inhaler Inhale 1-2 puffs every 4 (four) hours as needed into the lungs for wheezing or shortness of breath.   alendronate (FOSAMAX) 70 MG tablet Take 1 tablet (70 mg total) by mouth every 7 (seven) days. Take with a full glass of water on an empty stomach. Remain in seated/upright position for 30 mins.   aspirin 81 MG tablet Take 1 tablet by mouth daily.   b complex vitamins capsule Take 1 capsule by mouth daily.   carvedilol (COREG) 3.125 MG tablet Take 1 tablet by mouth daily.   Cholecalciferol (VITAMIN D) 2000 UNITS CAPS Take 1 capsule by mouth daily.   fluticasone  (FLONASE) 50 MCG/ACT nasal spray Place 2 sprays into both nostrils daily.   ivabradine (CORLANOR) 5 MG TABS tablet Take tablets (15mg ) TWO hours prior to your cardiac CT scan.   Ivermectin 1 % CREA Apply topically once daily.   loratadine (CLARITIN REDITABS) 10 MG dissolvable tablet Take 1 tablet by mouth as needed.   losartan (COZAAR) 25 MG tablet Take 1 tablet by mouth daily.   Magnesium Citrate 100 MG TABS Take 1 tablet by mouth daily. Reported on 06/05/2015   metoprolol tartrate (LOPRESSOR) 100 MG tablet Take tablet (100mg ) TWO hours prior to your Cardiac CT scan. Do not take your Carvedilol.   MOBIC 15 MG tablet Take 1 tablet every day by oral route.   MULTIPLE VITAMIN PO Take 1 tablet by mouth daily.   Multiple Vitamins-Minerals (VISION-VITE PRESERVE PO) Take by mouth.   nitroGLYCERIN (NITROSTAT) 0.4 MG SL tablet Place 1 tablet under the tongue as needed. Reported on 06/05/2015   simvastatin (ZOCOR) 40 MG tablet TAKE ONE-HALF TABLET DAILY   tetrahydrozoline-zinc (VISINE-AC) 0.05-0.25 % ophthalmic solution Apply 1 drop to eye daily.   No facility-administered encounter medications on file as of 07/01/2022.    Allergies (verified) Aspirin, Codeine, and Codeine sulfate   History: Past Medical History:  Diagnosis Date   Hypertension    Past Surgical History:  Procedure Laterality Date   CARDIAC CATHETERIZATION  2011   COLONOSCOPY WITH PROPOFOL N/A 08/21/2016  Procedure: COLONOSCOPY WITH PROPOFOL;  Surgeon: Wyline Mood, MD;  Location: [facility] ENDOSCOPY;  Service: Endoscopy;  Laterality: N/A;   DILATION AND CURETTAGE OF UTERUS  1980, 1981   AFTER 2 MISCARRIAGES   LEEP  1999   TONSILLECTOMY     TONSILLECTOMY AND ADENOIDECTOMY  1964   Family History  Problem Relation Age of Onset   Hypertension Brother    Breast cancer Maternal Aunt    Diabetes Paternal Uncle    Diabetes Maternal Grandmother    Pneumonia Paternal Grandmother    Coronary artery disease Mother    Liver cancer Mother     Heart disease Mother    Breast cancer Mother 25   Heart disease Father    Bladder Cancer Father    Heart attack Maternal Grandfather    Hyperlipidemia Other    Diabetes Other    Heart disease Other    Breast cancer Other        great aunt   Breast cancer Cousin    Social History   Socioeconomic History   Marital status: Married    Spouse name: Earl Lites   Number of children: 4   Years of education: some college   Highest education level: Not on file  Occupational History    Employer: RETIRED  Tobacco Use   Smoking status: Former    Years: 1    Types: Cigarettes   Smokeless tobacco: Never  Vaping Use   Vaping Use: Never used  Substance and Sexual Activity   Alcohol use: Yes    Alcohol/week: 0.0 standard drinks of alcohol    Comment: OCCASIONALLY   Drug use: No   Sexual activity: Yes  Other Topics Concern   Not on file  Social History Narrative   Not on file   Social Determinants of Health   Financial Resource Strain: Low Risk  (07/01/2022)   Overall Financial Resource Strain (CARDIA)    Difficulty of Paying Living Expenses: Not very hard  Food Insecurity: No Food Insecurity (07/01/2022)   Hunger Vital Sign    Worried About Running Out of Food in the Last Year: Never true    Ran Out of Food in the Last Year: Never true  Transportation Needs: No Transportation Needs (07/01/2022)   PRAPARE - Administrator, Civil Service (Medical): No    Lack of Transportation (Non-Medical): No  Physical Activity: Inactive (07/01/2022)   Exercise Vital Sign    Days of Exercise per Week: 0 days    Minutes of Exercise per Session: 0 min  Stress: No Stress Concern Present (07/01/2022)   [employer] of Occupational Health - Occupational Stress Questionnaire    Feeling of Stress : Not at all  Social Connections: Moderately Isolated (07/01/2022)   Social Connection and Isolation Panel [NHANES]    Frequency of Communication with Friends and Family: More than three times  a week    Frequency of Social Gatherings with Friends and Family: Once a week    Attends Religious Services: Never    Database administrator or Organizations: No    Attends Engineer, structural: Never    Marital Status: Married    Tobacco Counseling Counseling given: Not Answered   Clinical Intake:  Pre-visit preparation completed: Yes  Pain : 0-10 Pain Score: 3  Pain Type: Acute pain Pain Location: Head Pain Orientation: Left Pain Descriptors / Indicators: Aching Pain Onset: Yesterday Pain Frequency: Occasional Pain Relieving Factors: tylenol  Pain Relieving Factors: tylenol  BMI - recorded:  29.77 Nutritional Status: BMI 25 -29 Overweight Nutritional Risks: None Diabetes: No  How often do you need to have someone help you when you read instructions, pamphlets, or other written materials from your doctor or pharmacy?: 1 - Never  Diabetic?no  Interpreter Needed?: No  Comments: lives with husband Information entered by :: B.Dejean Tribby,LPN   Activities of Daily Living    07/01/2022   10:10 AM  In your present state of health, do you have any difficulty performing the following activities:  Hearing? 0  Vision? 0  Difficulty concentrating or making decisions? 0  Walking or climbing stairs? 0  Dressing or bathing? 0  Doing errands, shopping? 0  Preparing Food and eating ? N  Using the Toilet? N  In the past six months, have you accidently leaked urine? N  Do you have problems with loss of bowel control? N  Managing your Medications? N  Managing your Finances? N  Housekeeping or managing your Housekeeping? N    Patient Care Team: Erasmo Downer, MD as PCP - General (Family Medicine) Alwyn Pea, MD as Consulting Physician (Cardiology) Dasher, Cliffton Asters, MD (Dermatology)  Indicate any recent Medical Services you may have received from other than [facility] providers in the past year (date may be approximate).     Assessment:   This is a  routine wellness examination for Katherine White.  Hearing/Vision screen Hearing Screening - Comments:: Adequate hearing Vision Screening - Comments:: Adequate vision;readers only Dr [facility]   Dietary issues and exercise activities discussed: Current Exercise Habits: The patient does not participate in regular exercise at present, Exercise limited by: orthopedic condition(s)   Goals Addressed   None   Depression Screen    07/01/2022   10:06 AM 06/21/2021   11:53 AM 08/11/2019   10:31 AM 06/16/2018    9:23 AM 06/11/2017    9:16 AM 06/05/2016    9:19 AM  PHQ 2/9 Scores  PHQ - 2 Score 0 0 0 0 0 0  PHQ- 9 Score      2    Fall Risk    07/01/2022   10:00 AM 06/21/2021   11:54 AM 08/11/2019   10:31 AM 06/11/2017    9:16 AM 06/05/2016    9:19 AM  Fall Risk   Falls in the past year? 0 1 0 No No  Number falls in past yr: 0 0 0    Injury with Fall? 0 1 0    Risk for fall due to : No Fall Risks History of fall(s) No Fall Risks    Follow up Education provided;Falls prevention discussed Falls evaluation completed Falls evaluation completed      FALL RISK PREVENTION PERTAINING TO THE HOME:  Any stairs in or around the home? Yes  If so, are there any without handrails? Yes  Home free of loose throw rugs in walkways, pet beds, electrical cords, etc? Yes  Adequate lighting in your home to reduce risk of falls? Yes   ASSISTIVE DEVICES UTILIZED TO PREVENT FALLS:  Life alert? No  Use of a cane, walker or w/c? No  Grab bars in the bathroom? Yes  Shower chair or bench in shower? Yes  Elevated toilet seat or a handicapped toilet? Yes   Cognitive Function:        07/01/2022   10:20 AM  6CIT Screen  What Year? 0 points  What month? 0 points  What time? 0 points  Count back from 20 0 points  Months in  reverse 0 points  Repeat phrase 0 points  Total Score 0 points    Immunizations Immunization History  Administered Date(s) Administered   Influenza Split 10/23/2009, 11/21/2010    Influenza,inj,Quad PF,6+ Mos 11/09/2013, 11/17/2014, 03/15/2016   Influenza-Unspecified 12/03/2016, 02/02/2019   PFIZER(Purple Top)SARS-COV-2 Vaccination 05/11/2019, 06/01/2019   PNEUMOCOCCAL CONJUGATE-20 06/21/2021   Tdap 03/04/2006, 12/29/2013   Zoster Recombinat (Shingrix) 10/06/2017, 04/07/2018   Zoster, Live 04/10/2011    TDAP status: Up to date  Flu Vaccine status: Declined, Education has been provided regarding the importance of this vaccine but patient still declined. Advised may receive this vaccine at local pharmacy or Health Dept. Aware to provide a copy of the vaccination record if obtained from local pharmacy or Health Dept. Verbalized acceptance and understanding.  Pneumococcal vaccine status: Up to date  Covid-19 vaccine status: Completed vaccines  Qualifies for Shingles Vaccine? Yes   Zostavax completed Yes   Shingrix Completed?: Yes  Screening Tests Health Maintenance  Topic Date Due   COLONOSCOPY (Pts 45-2yrs Insurance coverage will need to be confirmed)  08/21/2021   COVID-19 Vaccine (3 - 2023-24 season) 10/11/2021   MAMMOGRAM  09/05/2022   INFLUENZA VACCINE  09/11/2022   Medicare Annual Wellness (AWV)  07/01/2023   DTaP/Tdap/Td (3 - Td or Tdap) 12/30/2023   Pneumonia Vaccine 69+ Years old  Completed   DEXA SCAN  Completed   Hepatitis C Screening  Completed   Zoster Vaccines- Shingrix  Completed   HPV VACCINES  Aged Out    Health Maintenance  Health Maintenance Due  Topic Date Due   COLONOSCOPY (Pts 45-34yrs Insurance coverage will need to be confirmed)  08/21/2021   COVID-19 Vaccine (3 - 2023-24 season) 10/11/2021    Colorectal cancer screening: Referral to GI placed yes. Pt aware the office will call re: appt.  Mammogram status: Ordered yes. Pt provided with contact info and advised to call to schedule appt.   Bone Density status: Completed yes. Results reflect: Bone density results: NORMAL. Repeat every 5 years.  Lung Cancer Screening: (Low  Dose CT Chest recommended if Age 42-80 years, 30 pack-year currently smoking OR have quit w/in 15years.) does not qualify.   Lung Cancer Screening Referral: no  Additional Screening:  Hepatitis C Screening: does not qualify; Completed yes  Vision Screening: Recommended annual ophthalmology exams for early detection of glaucoma and other disorders of the eye. Is the patient up to date with their annual eye exam?  Yes  Who is the provider or what is the name of the office in which the patient attends annual eye exams? [facility] If pt is not established with a provider, would they like to be referred to a provider to establish care? No .   Dental Screening: Recommended annual dental exams for proper oral hygiene  Community Resource Referral / Chronic Care Management: CRR required this visit?  No   CCM required this visit?  No      Plan:     I have personally reviewed and noted the following in the patient's chart:   Medical and social history Use of alcohol, tobacco or illicit drugs  Current medications and supplements including opioid prescriptions. Patient is not currently taking opioid prescriptions. Functional ability and status Nutritional status Physical activity Advanced directives List of other physicians Hospitalizations, surgeries, and ER visits in previous 12 months Vitals Screenings to include cognitive, depression, and falls Referrals and appointments  In addition, I have reviewed and discussed with patient certain preventive protocols, quality metrics, and  best practice recommendations. A written personalized care plan for preventive services as well as general preventive health recommendations were provided to patient.     Katherine Lush, LPN   [id number]/61/0960   Nurse Notes: The patient states they are doing well and has no concerns or questions at this time.

## 2022-07-02 NOTE — Progress Notes (Unsigned)
I,Sulibeya S Dimas,acting as a Neurosurgeon for Shirlee Latch, MD.,have documented all relevant documentation on the behalf of Shirlee Latch, MD,as directed by  Shirlee Latch, MD while in the presence of Shirlee Latch, MD.    Complete physical exam   Patient: Katherine White   DOB: 1955-03-11   67 y.o. Female  MRN: [id number] Visit Date: 07/03/2022  Today's healthcare provider: Shirlee Latch, MD   Chief Complaint  Patient presents with   Annual Exam   Subjective    Katherine White is a 67 y.o. female who presents today for a complete physical exam.  She reports consuming a general diet.She generally feels well. She reports sleeping fairly well. She does not have additional problems to discuss today.   HPI  Patient had AWV done 07/01/2022  Past Medical History:  Diagnosis Date   Hypertension    Past Surgical History:  Procedure Laterality Date   CARDIAC CATHETERIZATION  2011   COLONOSCOPY WITH PROPOFOL N/A 08/21/2016   Procedure: COLONOSCOPY WITH PROPOFOL;  Surgeon: Wyline Mood, MD;  Location: [facility] ENDOSCOPY;  Service: Endoscopy;  Laterality: N/A;   DILATION AND CURETTAGE OF UTERUS  1980, 1981   AFTER 2 MISCARRIAGES   LEEP  1999   TONSILLECTOMY     TONSILLECTOMY AND ADENOIDECTOMY  1964   Social History   Socioeconomic History   Marital status: Married    Spouse name: Earl Lites   Number of children: 4   Years of education: some college   Highest education level: Not on file  Occupational History    Employer: RETIRED  Tobacco Use   Smoking status: Former    Years: 1    Types: Cigarettes   Smokeless tobacco: Never  Vaping Use   Vaping Use: Never used  Substance and Sexual Activity   Alcohol use: Yes    Alcohol/week: 0.0 standard drinks of alcohol    Comment: OCCASIONALLY   Drug use: No   Sexual activity: Yes  Other Topics Concern   Not on file  Social History Narrative   Not on file   Social Determinants of Health   Financial  Resource Strain: Low Risk  (07/01/2022)   Overall Financial Resource Strain (CARDIA)    Difficulty of Paying Living Expenses: Not very hard  Food Insecurity: No Food Insecurity (07/01/2022)   Hunger Vital Sign    Worried About Running Out of Food in the Last Year: Never true    Ran Out of Food in the Last Year: Never true  Transportation Needs: No Transportation Needs (07/01/2022)   PRAPARE - Administrator, Civil Service (Medical): No    Lack of Transportation (Non-Medical): No  Physical Activity: Inactive (07/01/2022)   Exercise Vital Sign    Days of Exercise per Week: 0 days    Minutes of Exercise per Session: 0 min  Stress: No Stress Concern Present (07/01/2022)   [employer] of Occupational Health - Occupational Stress Questionnaire    Feeling of Stress : Not at all  Social Connections: Moderately Isolated (07/01/2022)   Social Connection and Isolation Panel [NHANES]    Frequency of Communication with Friends and Family: More than three times a week    Frequency of Social Gatherings with Friends and Family: Once a week    Attends Religious Services: Never    Database administrator or Organizations: No    Attends Banker Meetings: Never    Marital Status: Married  Catering manager Violence: Not At Risk (07/01/2022)  Humiliation, Afraid, Rape, and Kick questionnaire    Fear of Current or Ex-Partner: No    Emotionally Abused: No    Physically Abused: No    Sexually Abused: No   Family Status  Relation Name Status   Brother 1 Alive   Mat Aunt  Alive   Pat Uncle  Deceased   MGM  Deceased   PGM  Deceased   Mother  Deceased   Father  Deceased   MGF  Deceased   Brother 2 Alive   Other MGAunt (Not Specified)   Cousin maternal Alive   Family History  Problem Relation Age of Onset   Hypertension Brother    Breast cancer Maternal Aunt    Diabetes Paternal Uncle    Diabetes Maternal Grandmother    Pneumonia Paternal Grandmother    Coronary artery  disease Mother    Liver cancer Mother    Heart disease Mother    Breast cancer Mother 42   Heart disease Father    Bladder Cancer Father    Heart attack Maternal Grandfather    Hyperlipidemia Other    Diabetes Other    Heart disease Other    Breast cancer Other        great aunt   Breast cancer Cousin    Allergies  Allergen Reactions   Aspirin Other (See Comments)    Cannot tolerate large doses GI upset (High dosage) Can take 81 mg   Codeine    Codeine Sulfate Other (See Comments)    Patient Care Team: Erasmo Downer, MD as PCP - General (Family Medicine) Alwyn Pea, MD as Consulting Physician (Cardiology) Dasher, Cliffton Asters, MD (Dermatology)   Medications: Outpatient Medications Prior to Visit  Medication Sig   albuterol (PROVENTIL HFA;VENTOLIN HFA) 108 (90 Base) MCG/ACT inhaler Inhale 1-2 puffs every 4 (four) hours as needed into the lungs for wheezing or shortness of breath.   alendronate (FOSAMAX) 70 MG tablet Take 1 tablet (70 mg total) by mouth every 7 (seven) days. Take with a full glass of water on an empty stomach. Remain in seated/upright position for 30 mins.   aspirin 81 MG tablet Take 1 tablet by mouth daily.   b complex vitamins capsule Take 1 capsule by mouth daily.   carvedilol (COREG) 3.125 MG tablet Take 1 tablet by mouth daily.   Cholecalciferol (VITAMIN D) 2000 UNITS CAPS Take 1 capsule by mouth daily.   fluticasone (FLONASE) 50 MCG/ACT nasal spray Place 2 sprays into both nostrils daily.   ivabradine (CORLANOR) 5 MG TABS tablet Take tablets (15mg ) TWO hours prior to your cardiac CT scan.   Ivermectin 1 % CREA Apply topically once daily.   loratadine (CLARITIN REDITABS) 10 MG dissolvable tablet Take 1 tablet by mouth as needed.   losartan (COZAAR) 25 MG tablet Take 1 tablet by mouth daily.   Magnesium Citrate 100 MG TABS Take 1 tablet by mouth daily. Reported on 06/05/2015   metoprolol tartrate (LOPRESSOR) 100 MG tablet Take tablet (100mg ) TWO  hours prior to your Cardiac CT scan. Do not take your Carvedilol.   MOBIC 15 MG tablet Take 1 tablet every day by oral route.   MULTIPLE VITAMIN PO Take 1 tablet by mouth daily.   Multiple Vitamins-Minerals (VISION-VITE PRESERVE PO) Take by mouth.   nitroGLYCERIN (NITROSTAT) 0.4 MG SL tablet Place 1 tablet under the tongue as needed. Reported on 06/05/2015   simvastatin (ZOCOR) 40 MG tablet TAKE ONE-HALF TABLET DAILY   tetrahydrozoline-zinc (VISINE-AC) 0.05-0.25 %  ophthalmic solution Apply 1 drop to eye daily.   No facility-administered medications prior to visit.    Review of Systems  Constitutional:  Positive for unexpected weight change.  Musculoskeletal:  Positive for arthralgias, back pain and gait problem.  Allergic/Immunologic: Positive for environmental allergies.  All other systems reviewed and are negative.   Last CBC Lab Results  Component Value Date   WBC 6.9 08/11/2019   HGB 14.6 08/11/2019   HCT 44.1 08/11/2019   MCV 87 08/11/2019   MCH 28.9 08/11/2019   RDW 13.2 08/11/2019   PLT 254 08/11/2019   Last metabolic panel Lab Results  Component Value Date   GLUCOSE 82 08/11/2019   NA 141 08/11/2019   K 4.8 08/11/2019   CL 104 08/11/2019   CO2 23 08/11/2019   BUN 11 08/11/2019   CREATININE 0.70 11/21/2021   EGFR 91 11/23/2020   CALCIUM 9.7 08/11/2019   PROT 6.5 08/11/2019   ALBUMIN 4.3 08/11/2019   LABGLOB 2.2 08/11/2019   AGRATIO 2.0 08/11/2019   BILITOT 0.4 08/11/2019   ALKPHOS 105 08/11/2019   AST 15 11/23/2020   ALT 32 11/23/2020   Last lipids Lab Results  Component Value Date   CHOL 106 11/23/2020   HDL 46 11/23/2020   LDLCALC 75 11/23/2020   TRIG 107 11/23/2020   CHOLHDL 3.8 08/11/2019   Last hemoglobin A1c Lab Results  Component Value Date   HGBA1C 5.4 08/11/2019   Last thyroid functions Lab Results  Component Value Date   TSH 1.510 08/11/2019   T4TOTAL 7.1 03/16/2017   Last vitamin D Lab Results  Component Value Date   VD25OH  36.7 06/11/2017   Last vitamin B12 and Folate Lab Results  Component Value Date   VITAMINB12 747 03/16/2017   FOLATE 17.1 03/16/2017      Objective    BP 99/68 (BP Location: Left Arm, Patient Position: Sitting, Cuff Size: Normal)   Pulse 82   Temp 98.2 F (36.8 C) (Temporal)   Resp 12   Ht 5' (1.524 m)   Wt 157 lb 3.2 oz (71.3 kg)   SpO2 99%   BMI 30.70 kg/m  BP Readings from Last 3 Encounters:  07/03/22 99/68  12/19/21 116/69  11/21/21 99/65   Wt Readings from Last 3 Encounters:  07/03/22 157 lb 3.2 oz (71.3 kg)  07/01/22 155 lb (70.3 kg)  12/19/21 158 lb (71.7 kg)       Physical Exam Vitals reviewed.  Constitutional:      General: She is not in acute distress.    Appearance: Normal appearance. She is well-developed. She is not diaphoretic.  HENT:     Head: Normocephalic and atraumatic.     Right Ear: Tympanic membrane, ear canal and external ear normal.     Left Ear: Tympanic membrane, ear canal and external ear normal.     Nose: Nose normal.     Mouth/Throat:     Mouth: Mucous membranes are moist.     Pharynx: Oropharynx is clear. No oropharyngeal exudate.  Eyes:     General: No scleral icterus.    Conjunctiva/sclera: Conjunctivae normal.     Pupils: Pupils are equal, round, and reactive to light.  Neck:     Thyroid: No thyromegaly.  Cardiovascular:     Rate and Rhythm: Normal rate and regular rhythm.     Pulses: Normal pulses.     Heart sounds: Murmur heard.  Pulmonary:     Effort: Pulmonary effort is normal. No respiratory  distress.     Breath sounds: Normal breath sounds. No wheezing or rales.  Abdominal:     General: There is no distension.     Palpations: Abdomen is soft.     Tenderness: There is no abdominal tenderness.  Musculoskeletal:        General: No deformity.     Cervical back: Neck supple.     Right lower leg: No edema.     Left lower leg: No edema.  Lymphadenopathy:     Cervical: No cervical adenopathy.  Skin:    General: Skin  is warm and dry.     Findings: No rash.  Neurological:     Mental Status: She is alert and oriented to person, place, and time. Mental status is at baseline.     Gait: Gait normal.  Psychiatric:        Mood and Affect: Mood normal.        Behavior: Behavior normal.        Thought Content: Thought content normal.       Last depression screening scores    07/01/2022   10:06 AM 06/21/2021   11:53 AM 08/11/2019   10:31 AM  PHQ 2/9 Scores  PHQ - 2 Score 0 0 0   Last fall risk screening    07/01/2022   10:00 AM  Fall Risk   Falls in the past year? 0  Number falls in past yr: 0  Injury with Fall? 0  Risk for fall due to : No Fall Risks  Follow up Education provided;Falls prevention discussed   Last Audit-C alcohol use screening    07/01/2022   10:05 AM  Alcohol Use Disorder Test (AUDIT)  1. How often do you have a drink containing alcohol? 0  2. How many drinks containing alcohol do you have on a typical day when you are drinking? 0  3. How often do you have six or more drinks on one occasion? 0  AUDIT-C Score 0   A score of 3 or more in women, and 4 or more in men indicates increased risk for alcohol abuse, EXCEPT if all of the points are from question 1   No results found for any visits on 07/03/22.  Assessment & Plan    Routine Health Maintenance and Physical Exam  Exercise Activities and Dietary recommendations  Goals   None     Immunization History  Administered Date(s) Administered   Influenza Split 10/23/2009, 11/21/2010   Influenza,inj,Quad PF,6+ Mos 11/09/2013, 11/17/2014, 03/15/2016   Influenza-Unspecified 12/03/2016, 02/02/2019   PFIZER(Purple Top)SARS-COV-2 Vaccination 05/11/2019, 06/01/2019   PNEUMOCOCCAL CONJUGATE-20 06/21/2021   Tdap 03/04/2006, 12/29/2013   Zoster Recombinat (Shingrix) 10/06/2017, 04/07/2018   Zoster, Live 04/10/2011    Health Maintenance  Topic Date Due   COLONOSCOPY (Pts 45-69yrs Insurance coverage will need to be confirmed)   08/21/2021   COVID-19 Vaccine (3 - 2023-24 season) 10/11/2021   MAMMOGRAM  09/05/2022   INFLUENZA VACCINE  09/11/2022   Medicare Annual Wellness (AWV)  07/01/2023   DTaP/Tdap/Td (3 - Td or Tdap) 12/30/2023   Pneumonia Vaccine 25+ Years old  Completed   DEXA SCAN  Completed   Hepatitis C Screening  Completed   Zoster Vaccines- Shingrix  Completed   HPV VACCINES  Aged Out    Discussed health benefits of physical activity, and encouraged her to engage in regular exercise appropriate for her age and condition.  Problem List Items Addressed This Visit       Cardiovascular and  Mediastinum   BP (high blood pressure)    Well controlled Continue current medications Recheck metabolic panel      Relevant Orders   Comprehensive metabolic panel   Angina pectoris (HCC)    F/b cardiology        Other   Adiposity    Discussed importance of healthy weight management Discussed diet and exercise       Relevant Orders   CBC   TSH   Hypercholesterolemia without hypertriglyceridemia    Previously well controlled Continue statin Repeat FLP and CMP Goal LDL < 70       Relevant Orders   Comprehensive metabolic panel   Lipid panel   Avitaminosis D    Continue supplement Recheck level       Relevant Orders   VITAMIN D 25 Hydroxy (Vit-D Deficiency, Fractures)   History of SCC (squamous cell carcinoma) of skin    Followed regularly by Vaughan Sine      Other Visit Diagnoses     Encounter for annual physical exam    -  Primary   Relevant Orders   Comprehensive metabolic panel   Lipid panel   CBC   VITAMIN D 25 Hydroxy (Vit-D Deficiency, Fractures)   TSH   B12   Breast cancer screening by mammogram       Relevant Orders   MM 3D SCREENING MAMMOGRAM BILATERAL BREAST   Long-term use of high-risk medication       Relevant Orders   TSH   B12        Return in about 1 year (around 07/03/2023) for CPE.     I, Shirlee Latch, MD, have reviewed all documentation for this  visit. The documentation on 07/03/22 for the exam, diagnosis, procedures, and orders are all accurate and complete.   Bacigalupo, Marzella Schlein, MD, MPH [facility]

## 2022-07-03 ENCOUNTER — Ambulatory Visit (INDEPENDENT_AMBULATORY_CARE_PROVIDER_SITE_OTHER): Payer: Medicare Other | Admitting: Family Medicine

## 2022-07-03 ENCOUNTER — Telehealth: Payer: Self-pay | Admitting: Gastroenterology

## 2022-07-03 ENCOUNTER — Encounter: Payer: Self-pay | Admitting: Family Medicine

## 2022-07-03 VITALS — BP 99/68 | HR 82 | Temp 98.2°F | Resp 12 | Ht 60.0 in | Wt 157.2 lb

## 2022-07-03 DIAGNOSIS — E669 Obesity, unspecified: Secondary | ICD-10-CM | POA: Diagnosis not present

## 2022-07-03 DIAGNOSIS — Z6830 Body mass index (BMI) 30.0-30.9, adult: Secondary | ICD-10-CM

## 2022-07-03 DIAGNOSIS — Z0000 Encounter for general adult medical examination without abnormal findings: Secondary | ICD-10-CM | POA: Diagnosis not present

## 2022-07-03 DIAGNOSIS — I10 Essential (primary) hypertension: Secondary | ICD-10-CM | POA: Diagnosis not present

## 2022-07-03 DIAGNOSIS — Z79899 Other long term (current) drug therapy: Secondary | ICD-10-CM | POA: Diagnosis not present

## 2022-07-03 DIAGNOSIS — I209 Angina pectoris, unspecified: Secondary | ICD-10-CM

## 2022-07-03 DIAGNOSIS — Z85828 Personal history of other malignant neoplasm of skin: Secondary | ICD-10-CM | POA: Insufficient documentation

## 2022-07-03 DIAGNOSIS — E559 Vitamin D deficiency, unspecified: Secondary | ICD-10-CM | POA: Diagnosis not present

## 2022-07-03 DIAGNOSIS — E7800 Pure hypercholesterolemia, unspecified: Secondary | ICD-10-CM | POA: Diagnosis not present

## 2022-07-03 DIAGNOSIS — Z1231 Encounter for screening mammogram for malignant neoplasm of breast: Secondary | ICD-10-CM | POA: Diagnosis not present

## 2022-07-03 DIAGNOSIS — Z86010 Personal history of colonic polyps: Secondary | ICD-10-CM

## 2022-07-03 NOTE — Assessment & Plan Note (Signed)
Continue supplement Recheck level 

## 2022-07-03 NOTE — Assessment & Plan Note (Signed)
Discussed importance of healthy weight management Discussed diet and exercise  

## 2022-07-03 NOTE — Assessment & Plan Note (Signed)
F/b cardiology 

## 2022-07-03 NOTE — Assessment & Plan Note (Signed)
Well controlled Continue current medications Recheck metabolic panel 

## 2022-07-03 NOTE — Telephone Encounter (Signed)
Pt left message returning call about colonoscopy will not be able to receive call back until end of next week

## 2022-07-03 NOTE — Assessment & Plan Note (Signed)
Previously well controlled Continue statin Repeat FLP and CMP Goal LDL < 70 

## 2022-07-03 NOTE — Assessment & Plan Note (Signed)
Followed regularly by Vaughan Sine

## 2022-07-04 LAB — LIPID PANEL
Chol/HDL Ratio: 3.7 ratio (ref 0.0–4.4)
Cholesterol, Total: 193 mg/dL (ref 100–199)
HDL: 52 mg/dL (ref >39)
LDL Chol Calc (NIH): 116 mg/dL — ABNORMAL HIGH (ref 0–99)
Triglycerides: 140 mg/dL (ref 0–149)
VLDL Cholesterol Cal: 25 mg/dL (ref 5–40)

## 2022-07-04 LAB — CBC
Hematocrit: 47 % — ABNORMAL HIGH (ref 34.0–46.6)
Hemoglobin: 15.4 g/dL (ref 11.1–15.9)
MCH: 28.5 pg (ref 26.6–33.0)
MCHC: 32.8 g/dL (ref 31.5–35.7)
MCV: 87 fL (ref 79–97)
Platelets: 274 10*3/uL (ref 150–450)
RBC: 5.4 x10E6/uL — ABNORMAL HIGH (ref 3.77–5.28)
RDW: 13.1 % (ref 11.7–15.4)
WBC: 7.6 10*3/uL (ref 3.4–10.8)

## 2022-07-04 LAB — COMPREHENSIVE METABOLIC PANEL
ALT: 29 IU/L (ref 0–32)
AST: 25 IU/L (ref 0–40)
Albumin/Globulin Ratio: 1.8 (ref 1.2–2.2)
Albumin: 4.4 g/dL (ref 3.9–4.9)
Alkaline Phosphatase: 122 IU/L — ABNORMAL HIGH (ref 44–121)
BUN/Creatinine Ratio: 14 (ref 12–28)
BUN: 13 mg/dL (ref 8–27)
Bilirubin Total: 0.4 mg/dL (ref 0.0–1.2)
CO2: 21 mmol/L (ref 20–29)
Calcium: 10.1 mg/dL (ref 8.7–10.3)
Chloride: 104 mmol/L (ref 96–106)
Creatinine, Ser: 0.91 mg/dL (ref 0.57–1.00)
Globulin, Total: 2.4 g/dL (ref 1.5–4.5)
Glucose: 105 mg/dL — ABNORMAL HIGH (ref 70–99)
Potassium: 5.2 mmol/L (ref 3.5–5.2)
Sodium: 142 mmol/L (ref 134–144)
Total Protein: 6.8 g/dL (ref 6.0–8.5)
eGFR: 70 mL/min/{1.73_m2} (ref >59)

## 2022-07-04 LAB — VITAMIN B12: Vitamin B-12: 800 pg/mL (ref 232–1245)

## 2022-07-04 LAB — TSH: TSH: 1.87 u[IU]/mL (ref 0.450–4.500)

## 2022-07-04 LAB — VITAMIN D 25 HYDROXY (VIT D DEFICIENCY, FRACTURES): Vit D, 25-Hydroxy: 34.8 ng/mL (ref 30.0–100.0)

## 2022-07-09 ENCOUNTER — Other Ambulatory Visit: Payer: Self-pay

## 2022-07-09 DIAGNOSIS — Z86010 Personal history of colonic polyps: Secondary | ICD-10-CM

## 2022-07-09 MED ORDER — NA SULFATE-K SULFATE-MG SULF 17.5-3.13-1.6 GM/177ML PO SOLN: 1.0000 | Freq: Once | ORAL | 0 refills | Status: AC

## 2022-07-09 NOTE — Addendum Note (Signed)
Addended by: Avie Arenas on: 07/09/2022 08:53 AM   Modules accepted: Orders

## 2022-07-09 NOTE — Telephone Encounter (Signed)
Gastroenterology Pre-Procedure Review  Request Date: 08/20/22 Requesting Physician: Dr.  Tobi Bastos  PATIENT REVIEW QUESTIONS: The patient responded to the following health history questions as indicated via mychart:    1. Are you having any GI issues? no 2. Do you have a personal history of Polyps? yes (last colonoscopy 08/21/2016 with Dr. Tobi Bastos polyps were noted) 3. Do you have a family history of Colon Cancer or Polyps? no 4. Diabetes Mellitus? no 5. Joint replacements in the past 12 months?no 6. Major health problems in the past 3 months?no 7. Any artificial heart valves, MVP, or defibrillator?no    MEDICATIONS & ALLERGIES:    Patient reports the following regarding taking any anticoagulation/antiplatelet therapy:   Plavix, Coumadin, Eliquis, Xarelto, Lovenox, Pradaxa, Brilinta, or Effient? no Aspirin? no  Patient confirms/reports the following medications:  Current Outpatient Medications  Medication Sig Dispense Refill   albuterol (PROVENTIL HFA;VENTOLIN HFA) 108 (90 Base) MCG/ACT inhaler Inhale 1-2 puffs every 4 (four) hours as needed into the lungs for wheezing or shortness of breath. 1 Inhaler 0   alendronate (FOSAMAX) 70 MG tablet Take 1 tablet (70 mg total) by mouth every 7 (seven) days. Take with a full glass of water on an empty stomach. Remain in seated/upright position for 30 mins. 4 tablet 11   aspirin 81 MG tablet Take 1 tablet by mouth daily.     b complex vitamins capsule Take 1 capsule by mouth daily.     carvedilol (COREG) 3.125 MG tablet Take 1 tablet by mouth daily.     Cholecalciferol (VITAMIN D) 2000 UNITS CAPS Take 1 capsule by mouth daily.     fluticasone (FLONASE) 50 MCG/ACT nasal spray Place 2 sprays into both nostrils daily. 16 g 3   ivabradine (CORLANOR) 5 MG TABS tablet Take tablets (15mg ) TWO hours prior to your cardiac CT scan. 3 tablet 0   Ivermectin 1 % CREA Apply topically once daily.     loratadine (CLARITIN REDITABS) 10 MG dissolvable tablet Take 1  tablet by mouth as needed.     losartan (COZAAR) 25 MG tablet Take 1 tablet by mouth daily.     Magnesium Citrate 100 MG TABS Take 1 tablet by mouth daily. Reported on 06/05/2015     metoprolol tartrate (LOPRESSOR) 100 MG tablet Take tablet (100mg ) TWO hours prior to your Cardiac CT scan. Do not take your Carvedilol. 1 tablet 0   MOBIC 15 MG tablet Take 1 tablet every day by oral route.     MULTIPLE VITAMIN PO Take 1 tablet by mouth daily.     Multiple Vitamins-Minerals (VISION-VITE PRESERVE PO) Take by mouth.     nitroGLYCERIN (NITROSTAT) 0.4 MG SL tablet Place 1 tablet under the tongue as needed. Reported on 06/05/2015     simvastatin (ZOCOR) 40 MG tablet TAKE ONE-HALF TABLET DAILY 30 tablet 5   tetrahydrozoline-zinc (VISINE-AC) 0.05-0.25 % ophthalmic solution Apply 1 drop to eye daily.     No current facility-administered medications for this visit.    Patient confirms/reports the following allergies:  Allergies  Allergen Reactions   Aspirin Other (See Comments)    Cannot tolerate large doses GI upset (High dosage) Can take 81 mg   Codeine    Codeine Sulfate Other (See Comments)    No orders of the defined types were placed in this encounter.   AUTHORIZATION INFORMATION Primary Insurance: 1D#: Group #:  Secondary Insurance: 1D#: Group #:  SCHEDULE INFORMATION: Date: 08/20/22 Time: Location: [facility]

## 2022-07-11 NOTE — Telephone Encounter (Signed)
Patient sent mychart message requesting to reschedule her colonoscopy from 08/20/22 to 09/04/22. Trish in Endo has been informed of date change.

## 2022-08-12 DIAGNOSIS — K080 Exfoliation of teeth due to systemic causes: Secondary | ICD-10-CM | POA: Diagnosis not present

## 2022-08-20 DIAGNOSIS — D225 Melanocytic nevi of trunk: Secondary | ICD-10-CM | POA: Diagnosis not present

## 2022-08-20 DIAGNOSIS — D2262 Melanocytic nevi of left upper limb, including shoulder: Secondary | ICD-10-CM | POA: Diagnosis not present

## 2022-08-20 DIAGNOSIS — D2261 Melanocytic nevi of right upper limb, including shoulder: Secondary | ICD-10-CM | POA: Diagnosis not present

## 2022-08-20 DIAGNOSIS — Z85828 Personal history of other malignant neoplasm of skin: Secondary | ICD-10-CM | POA: Diagnosis not present

## 2022-08-28 ENCOUNTER — Encounter: Payer: Self-pay | Admitting: Gastroenterology

## 2022-08-28 ENCOUNTER — Ambulatory Visit (INDEPENDENT_AMBULATORY_CARE_PROVIDER_SITE_OTHER): Payer: Medicare Other | Admitting: Family Medicine

## 2022-08-28 ENCOUNTER — Telehealth: Payer: Self-pay

## 2022-08-28 ENCOUNTER — Encounter: Payer: Self-pay | Admitting: Family Medicine

## 2022-08-28 VITALS — BP 106/50 | HR 99 | Wt 160.8 lb

## 2022-08-28 DIAGNOSIS — K5792 Diverticulitis of intestine, part unspecified, without perforation or abscess without bleeding: Secondary | ICD-10-CM | POA: Diagnosis not present

## 2022-08-28 MED ORDER — SULFAMETHOXAZOLE-TRIMETHOPRIM 800-160 MG PO TABS: 1.0000 | ORAL_TABLET | Freq: Two times a day (BID) | ORAL | 0 refills | Status: DC

## 2022-08-28 MED ORDER — METRONIDAZOLE 500 MG PO TABS: 500.0000 mg | ORAL_TABLET | Freq: Three times a day (TID) | ORAL | 0 refills | Status: AC

## 2022-08-28 MED ORDER — FLUCONAZOLE 150 MG PO TABS: ORAL_TABLET | ORAL | 0 refills | Status: DC

## 2022-08-28 NOTE — Progress Notes (Signed)
Patient stated she my being having a episode of diverticulitis. Suggested to the patient to call primary care to get an appointment and treatment for diverticulitis.

## 2022-08-28 NOTE — Progress Notes (Signed)
Established patient visit   Patient: Katherine White   DOB: 12-12-1955   67 y.o. Female  MRN: [id number] Visit Date: 08/28/2022  Today's healthcare provider: Jacky Kindle, FNP  Introduced to nurse practitioner role and practice setting.  All questions answered.  Discussed provider/patient relationship and expectations.  Chief Complaint  Patient presents with   Diverticulitis   Subjective    HPI HPI   Patient says she is scheduled for an upcoming scheduled Colonoscopy. Patient says she was told before she has the scheduled procedure, that she needs to be checked out to see if she is having a Diverticulitis flare up. Patient says she is experiencing cramping off and on yesterday and felt like something big was in her side. Patient says she feels as if she would relieve some gas, she would feel better. Patient decline having any diarrhea. Patient would like to discuss with provider at today's visit.  Last edited by Malen Gauze, CMA on 08/28/2022  2:05 PM.      Medications: Outpatient Medications Prior to Visit  Medication Sig   albuterol (PROVENTIL HFA;VENTOLIN HFA) 108 (90 Base) MCG/ACT inhaler Inhale 1-2 puffs every 4 (four) hours as needed into the lungs for wheezing or shortness of breath.   alendronate (FOSAMAX) 70 MG tablet Take 1 tablet (70 mg total) by mouth every 7 (seven) days. Take with a full glass of water on an empty stomach. Remain in seated/upright position for 30 mins.   aspirin 81 MG tablet Take 1 tablet by mouth daily.   b complex vitamins capsule Take 1 capsule by mouth daily.   carvedilol (COREG) 3.125 MG tablet Take 1 tablet by mouth daily.   Cholecalciferol (VITAMIN D) 2000 UNITS CAPS Take 1 capsule by mouth daily.   fluticasone (FLONASE) 50 MCG/ACT nasal spray Place 2 sprays into both nostrils daily.   Ivermectin 1 % CREA Apply topically once daily.   loratadine (CLARITIN REDITABS) 10 MG dissolvable tablet Take 1 tablet by mouth as needed.    losartan (COZAAR) 25 MG tablet Take 1 tablet by mouth daily.   Magnesium Citrate 100 MG TABS Take 1 tablet by mouth daily. Reported on 06/05/2015   MOBIC 15 MG tablet Take 1 tablet every day by oral route.   MULTIPLE VITAMIN PO Take 1 tablet by mouth daily.   Multiple Vitamins-Minerals (VISION-VITE PRESERVE PO) Take by mouth.   nitroGLYCERIN (NITROSTAT) 0.4 MG SL tablet Place 1 tablet under the tongue as needed. Reported on 06/05/2015   [DISCONTINUED] ivabradine (CORLANOR) 5 MG TABS tablet Take tablets (15mg ) TWO hours prior to your cardiac CT scan.   [DISCONTINUED] metoprolol tartrate (LOPRESSOR) 100 MG tablet Take tablet (100mg ) TWO hours prior to your Cardiac CT scan. Do not take your Carvedilol.   [DISCONTINUED] simvastatin (ZOCOR) 40 MG tablet TAKE ONE-HALF TABLET DAILY   No facility-administered medications prior to visit.    Review of Systems    Objective    BP (!) 106/50   Pulse 99   Wt 160 lb 12.8 oz (72.9 kg)   SpO2 98%   BMI 31.40 kg/m   Physical Exam Vitals and nursing note reviewed.  Constitutional:      General: She is not in acute distress.    Appearance: Normal appearance. She is obese. She is ill-appearing. She is not toxic-appearing or diaphoretic.  HENT:     Head: Normocephalic and atraumatic.  Cardiovascular:     Rate and Rhythm: Normal rate and regular rhythm.  Pulses: Normal pulses.     Heart sounds: Normal heart sounds. No murmur heard.    No friction rub. No gallop.  Pulmonary:     Effort: Pulmonary effort is normal. No respiratory distress.     Breath sounds: Normal breath sounds. No stridor. No wheezing, rhonchi or rales.  Chest:     Chest wall: No tenderness.  Abdominal:     General: Bowel sounds are normal.     Palpations: Abdomen is soft. There is no mass.     Tenderness: There is abdominal tenderness. There is guarding. There is no right CVA tenderness or left CVA tenderness.     Hernia: No hernia is present.  Musculoskeletal:         General: No swelling, tenderness, deformity or signs of injury. Normal range of motion.     Right lower leg: No edema.     Left lower leg: No edema.  Skin:    General: Skin is warm and dry.     Capillary Refill: Capillary refill takes less than 2 seconds.     Coloration: Skin is not jaundiced or pale.     Findings: No bruising, erythema, lesion or rash.  Neurological:     General: No focal deficit present.     Mental Status: She is alert and oriented to person, place, and time. Mental status is at baseline.     Cranial Nerves: No cranial nerve deficit.     Sensory: No sensory deficit.     Motor: No weakness.     Coordination: Coordination normal.  Psychiatric:        Mood and Affect: Mood normal.        Behavior: Behavior normal.        Thought Content: Thought content normal.        Judgment: Judgment normal.     Problem List Items Addressed This Visit       Other   Diverticulitis - Primary    Acute, self limiting Request treatment given abdominal pain, flares which cause dizziness and abdominal cramping Start bland diet/liquid/low residual Encouraged to refrain from heavy meats, dairy, excess fiber or sweets Pt reports hx of abx related yeast infection; treatment provided if flare occurs. Has an upcoming colonoscopy with Dr Tobi Bastos; encouraged to reach out Also with borderline hypotension with hx of takotsubo and HFmrEF- recommend consideration for change of coreg dosing Encouraged to hold statin during abx use Discussed red flag symptoms of when to seek higher level of care      Relevant Medications   sulfamethoxazole-trimethoprim (BACTRIM DS) 800-160 MG tablet   metroNIDAZOLE (FLAGYL) 500 MG tablet   fluconazole (DIFLUCAN) 150 MG tablet     No results found for any visits on 08/28/22.  Assessment & Plan  Problem List Items Addressed This Visit       Other   Diverticulitis - Primary    Acute, self limiting Request treatment given abdominal pain, flares which cause  dizziness and abdominal cramping Start bland diet/liquid/low residual Encouraged to refrain from heavy meats, dairy, excess fiber or sweets Pt reports hx of abx related yeast infection; treatment provided if flare occurs. Has an upcoming colonoscopy with Dr Tobi Bastos; encouraged to reach out Also with borderline hypotension with hx of takotsubo and HFmrEF- recommend consideration for change of coreg dosing Encouraged to hold statin during abx use Discussed red flag symptoms of when to seek higher level of care      Relevant Medications   sulfamethoxazole-trimethoprim (BACTRIM DS) 800-160  MG tablet   metroNIDAZOLE (FLAGYL) 500 MG tablet   fluconazole (DIFLUCAN) 150 MG tablet     I, Jacky Kindle, FNP, have reviewed all documentation for this visit. The documentation on 08/28/22 for the exam, diagnosis, procedures, and orders are all accurate and complete.  Jacky Kindle, FNP  [facility] [phone] (phone) [phone] (fax)  [facility]

## 2022-08-28 NOTE — Telephone Encounter (Signed)
Patient called stating that she started to feel like she was having symptoms of diverticulitis (pain on her left lower quadrant, nausea, vomiting). Therefore, she called her PCP's office and was able to see the NP and was given antibiotics. Patient stated that she will start them tonight. However, I told her that we would have to reschedule her colonoscopy scheduled for next week to October 09, 2022. Patient agreed and procedure was rescheduled after I called the endoscopy unit to change the date. Patient understood and had no further questions.

## 2022-08-28 NOTE — Assessment & Plan Note (Signed)
Acute, self limiting Request treatment given abdominal pain, flares which cause dizziness and abdominal cramping Start bland diet/liquid/low residual Encouraged to refrain from heavy meats, dairy, excess fiber or sweets Pt reports hx of abx related yeast infection; treatment provided if flare occurs. Has an upcoming colonoscopy with Dr Tobi Bastos; encouraged to reach out Also with borderline hypotension with hx of takotsubo and HFmrEF- recommend consideration for change of coreg dosing Encouraged to hold statin during abx use Discussed red flag symptoms of when to seek higher level of care

## 2022-09-05 DIAGNOSIS — K080 Exfoliation of teeth due to systemic causes: Secondary | ICD-10-CM | POA: Diagnosis not present

## 2022-09-09 ENCOUNTER — Ambulatory Visit
Admission: RE | Admit: 2022-09-09 | Discharge: 2022-09-09 | Disposition: A | Discharge status: Home / Self Care | Payer: Medicare Other | Source: Ambulatory Visit | Attending: Family Medicine | Admitting: Family Medicine

## 2022-09-09 DIAGNOSIS — Z1231 Encounter for screening mammogram for malignant neoplasm of breast: Secondary | ICD-10-CM | POA: Diagnosis not present

## 2022-10-02 DIAGNOSIS — M722 Plantar fascial fibromatosis: Secondary | ICD-10-CM | POA: Diagnosis not present

## 2022-10-02 DIAGNOSIS — K080 Exfoliation of teeth due to systemic causes: Secondary | ICD-10-CM | POA: Diagnosis not present

## 2022-10-02 DIAGNOSIS — M65872 Other synovitis and tenosynovitis, left ankle and foot: Secondary | ICD-10-CM | POA: Diagnosis not present

## 2022-10-08 ENCOUNTER — Encounter: Payer: Self-pay | Admitting: Gastroenterology

## 2022-10-09 ENCOUNTER — Ambulatory Visit
Admission: RE | Admit: 2022-10-09 | Discharge: 2022-10-09 | Disposition: A | Discharge status: Home / Self Care | Payer: Medicare Other | Attending: Gastroenterology | Admitting: Gastroenterology

## 2022-10-09 ENCOUNTER — Encounter
Admission: RE | Disposition: A | Discharge status: Home / Self Care | Payer: Self-pay | Source: Home / Self Care | Attending: Gastroenterology

## 2022-10-09 ENCOUNTER — Ambulatory Visit: Payer: Medicare Other | Admitting: General Practice

## 2022-10-09 DIAGNOSIS — Z1211 Encounter for screening for malignant neoplasm of colon: Secondary | ICD-10-CM | POA: Insufficient documentation

## 2022-10-09 DIAGNOSIS — Z87891 Personal history of nicotine dependence: Secondary | ICD-10-CM | POA: Diagnosis not present

## 2022-10-09 DIAGNOSIS — K640 First degree hemorrhoids: Secondary | ICD-10-CM | POA: Insufficient documentation

## 2022-10-09 DIAGNOSIS — D125 Benign neoplasm of sigmoid colon: Secondary | ICD-10-CM | POA: Insufficient documentation

## 2022-10-09 DIAGNOSIS — I252 Old myocardial infarction: Secondary | ICD-10-CM | POA: Diagnosis not present

## 2022-10-09 DIAGNOSIS — I10 Essential (primary) hypertension: Secondary | ICD-10-CM | POA: Diagnosis not present

## 2022-10-09 DIAGNOSIS — K649 Unspecified hemorrhoids: Secondary | ICD-10-CM | POA: Diagnosis not present

## 2022-10-09 DIAGNOSIS — Z860100 Personal history of colon polyps, unspecified: Secondary | ICD-10-CM

## 2022-10-09 DIAGNOSIS — D126 Benign neoplasm of colon, unspecified: Secondary | ICD-10-CM

## 2022-10-09 DIAGNOSIS — K5730 Diverticulosis of large intestine without perforation or abscess without bleeding: Secondary | ICD-10-CM | POA: Insufficient documentation

## 2022-10-09 DIAGNOSIS — K635 Polyp of colon: Secondary | ICD-10-CM | POA: Diagnosis not present

## 2022-10-09 HISTORY — PX: COLONOSCOPY WITH PROPOFOL: SHX5780

## 2022-10-09 HISTORY — DX: Hyperlipidemia, unspecified: E78.5

## 2022-10-09 HISTORY — PX: POLYPECTOMY: SHX5525

## 2022-10-09 HISTORY — DX: Acute myocardial infarction, unspecified: I21.9

## 2022-10-09 SURGERY — COLONOSCOPY WITH PROPOFOL
Anesthesia: General

## 2022-10-09 MED ORDER — SODIUM CHLORIDE 0.9 % IV SOLN: INTRAVENOUS | Status: DC

## 2022-10-09 MED ORDER — PROPOFOL 500 MG/50ML IV EMUL
INTRAVENOUS | Status: DC | PRN
  Administered 2022-10-09: 150 ug/kg/min via INTRAVENOUS

## 2022-10-09 MED ORDER — PROPOFOL 10 MG/ML IV BOLUS
INTRAVENOUS | Status: DC | PRN
  Administered 2022-10-09 (×2): 10 mg via INTRAVENOUS
  Administered 2022-10-09: 70 mg via INTRAVENOUS

## 2022-10-09 NOTE — Transfer of Care (Signed)
Immediate Anesthesia Transfer of Care Note  Patient: Katherine White  Procedure(s) Performed: COLONOSCOPY WITH PROPOFOL BIOPSY  Patient Location: PACU  Anesthesia Type:MAC  Level of Consciousness: drowsy and patient cooperative  Airway & Oxygen Therapy: Patient Spontanous Breathing and Patient connected to nasal cannula oxygen  Post-op Assessment: Report given to RN and Post -op Vital signs reviewed and stable  Post vital signs: Reviewed and stable  Last Vitals:  Vitals Value Taken Time  BP 90/57 10/09/22 0933  Temp 35.9 C 10/09/22 0933  Pulse 78 10/09/22 0934  Resp 16 10/09/22 0934  SpO2 97 % 10/09/22 0934  Vitals shown include unfiled device data.  Last Pain:  Vitals:   10/09/22 0933  TempSrc: Temporal  PainSc: Asleep         Complications: No notable events documented.

## 2022-10-09 NOTE — Anesthesia Preprocedure Evaluation (Signed)
Anesthesia Evaluation  Patient identified by MRN, date of birth, ID band Patient awake    Reviewed: Allergy & Precautions, NPO status , Patient's Chart, lab work & pertinent test results  History of Anesthesia Complications (+) PONV and history of anesthetic complications  Airway Mallampati: III  TM Distance: >3 FB Neck ROM: full    Dental  (+) Chipped   Pulmonary neg pulmonary ROS, neg sleep apnea, neg COPD, former smoker   Pulmonary exam normal breath sounds clear to auscultation- rhonchi (-) wheezing      Cardiovascular Exercise Tolerance: Good hypertension, Pt. on medications (-) Cardiac Stents and (-) CABG Past MI: hx of Takasubo in 2011.negative cardio ROS Normal cardiovascular exam Rhythm:Regular Rate:Normal - Systolic murmurs and - Diastolic murmurs Hx of takisubo disease   Neuro/Psych negative neurological ROS  negative psych ROS   GI/Hepatic negative GI ROS, Neg liver ROS,,,  Endo/Other  negative endocrine ROSneg diabetes    Renal/GU negative Renal ROS  negative genitourinary   Musculoskeletal   Abdominal   Peds  Hematology negative hematology ROS (+)   Anesthesia Other Findings Past Medical History: No date: Hyperlipoproteinemia No date: Hypertension No date: Myocardial infarction [facility])  Past Surgical History: 2011: CARDIAC CATHETERIZATION 08/21/2016: COLONOSCOPY WITH PROPOFOL; N/A     Comment:  Procedure: COLONOSCOPY WITH PROPOFOL;  Surgeon: Wyline Mood, MD;  Location: [facility] ENDOSCOPY;  Service:               Endoscopy;  Laterality: N/A; 1980, 1981: DILATION AND CURETTAGE OF UTERUS     Comment:  AFTER 2 MISCARRIAGES No date: FRACTURE SURGERY 1999: LEEP No date: SKIN CANCER EXCISION     Comment:  chest and left side of bridge of nose No date: TONSILLECTOMY 1964: TONSILLECTOMY AND ADENOIDECTOMY  BMI    Body Mass Index: 29.72 kg/m      Reproductive/Obstetrics negative OB  ROS                             Anesthesia Physical Anesthesia Plan  ASA: 3  Anesthesia Plan: General   Post-op Pain Management:    Induction: Intravenous  PONV Risk Score and Plan: Ondansetron, Propofol infusion and TIVA  Airway Management Planned: Natural Airway and Nasal Cannula  Additional Equipment:   Intra-op Plan:   Post-operative Plan:   Informed Consent: I have reviewed the patients History and Physical, chart, labs and discussed the procedure including the risks, benefits and alternatives for the proposed anesthesia with the patient or authorized representative who has indicated his/her understanding and acceptance.     Dental Advisory Given  Plan Discussed with: Anesthesiologist, CRNA and Surgeon  Anesthesia Plan Comments: (Patient consented for risks of anesthesia including but not limited to:  - adverse reactions to medications - risk of airway placement if required - damage to eyes, teeth, lips or other oral mucosa - nerve damage due to positioning  - sore throat or hoarseness - Damage to heart, brain, nerves, lungs, other parts of body or loss of life  Patient voiced understanding.)       Anesthesia Quick Evaluation

## 2022-10-09 NOTE — Op Note (Signed)
[facility] Patient Name: Katherine White Procedure Date: 10/09/2022 9:06 AM MRN: [id number] Account #: [id number] Date of Birth: Jun 08, 1955 Admit Type: Outpatient Age: 67 Room: [facility] ENDO ROOM 4 Gender: Female Note Status: Finalized Instrument Name: Peds Colonoscope [id number] Procedure:             Colonoscopy Indications:           Surveillance: Personal history of adenomatous polyps                         on last colonoscopy 5 years ago Providers:             Wyline Mood MD, MD Referring MD:          Marzella Schlein. Bacigalupo (Referring MD) Medicines:             Monitored Anesthesia Care Complications:         No immediate complications. Procedure:             Pre-Anesthesia Assessment:                        - Prior to the procedure, a History and Physical was                         performed, and patient medications, allergies and                         sensitivities were reviewed. The patient's tolerance                         of previous anesthesia was reviewed.                        - The risks and benefits of the procedure and the                         sedation options and risks were discussed with the                         patient. All questions were answered and informed                         consent was obtained.                        - ASA Grade Assessment: II - A patient with mild                         systemic disease.                        After obtaining informed consent, the colonoscope was                         passed under direct vision. Throughout the procedure,                         the patient's blood pressure, pulse, and oxygen  saturations were monitored continuously. The                         Colonoscope was introduced through the anus and                         advanced to the the cecum, identified by the                         appendiceal orifice. The colonoscopy was performed                          with ease. The patient tolerated the procedure well.                         The quality of the bowel preparation was excellent.                         The ileocecal valve, appendiceal orifice, and rectum                         were photographed. Findings:      The perianal and digital rectal examinations were normal.      Multiple small-mouthed diverticula were found in the sigmoid colon.      A 3 mm polyp was found in the sigmoid colon. The polyp was sessile. The       polyp was removed with a cold snare. Resection and retrieval were       complete.      Non-bleeding internal hemorrhoids were found during retroflexion. The       hemorrhoids were large and Grade I (internal hemorrhoids that do not       prolapse).      The exam was otherwise without abnormality on direct and retroflexion       views. Impression:            - Diverticulosis in the sigmoid colon.                        - One 3 mm polyp in the sigmoid colon, removed with a                         cold snare. Resected and retrieved.                        - Non-bleeding internal hemorrhoids.                        - The examination was otherwise normal on direct and                         retroflexion views. Recommendation:        - Discharge patient to home (with escort).                        - Resume previous diet.                        - Continue present medications.                        -  Await pathology results.                        - Repeat colonoscopy for surveillance based on                         pathology results. Procedure Code(s):     --- Professional ---                        [phone], Colonoscopy, flexible; with removal of                         tumor(s), polyp(s), or other lesion(s) by snare                         technique Diagnosis Code(s):     --- Professional ---                        Z86.010, Personal history of colonic polyps                        K64.0, First degree  hemorrhoids                        D12.5, Benign neoplasm of sigmoid colon                        K57.30, Diverticulosis of large intestine without                         perforation or abscess without bleeding CPT copyright 2022 [employer]. All rights reserved. The codes documented in this report are preliminary and upon coder review may  be revised to meet current compliance requirements. Wyline Mood, MD Wyline Mood MD, MD 10/09/2022 9:32:37 AM This report has been signed electronically. Number of Addenda: 0 Note Initiated On: 10/09/2022 9:06 AM Scope Withdrawal Time: 0 hours 11 minutes 47 seconds  Total Procedure Duration: 0 hours 18 minutes 5 seconds  Estimated Blood Loss:  Estimated blood loss: none.      [facility]

## 2022-10-09 NOTE — Anesthesia Postprocedure Evaluation (Signed)
Anesthesia Post Note  Patient: Katherine White  Procedure(s) Performed: COLONOSCOPY WITH PROPOFOL BIOPSY  Patient location during evaluation: Endoscopy Anesthesia Type: General Level of consciousness: awake and alert Pain management: pain level controlled Vital Signs Assessment: post-procedure vital signs reviewed and stable Respiratory status: spontaneous breathing, nonlabored ventilation, respiratory function stable and patient connected to nasal cannula oxygen Cardiovascular status: blood pressure returned to baseline and stable Postop Assessment: no apparent nausea or vomiting Anesthetic complications: no  No notable events documented.   Last Vitals:  Vitals:   10/09/22 0933 10/09/22 0943  BP: (!) 90/57 104/69  Pulse:    Resp:    Temp: (!) 35.9 C   SpO2:      Last Pain:  Vitals:   10/09/22 0943  TempSrc:   PainSc: 0-No pain                 Stephanie Coup

## 2022-10-09 NOTE — H&P (Signed)
Wyline Mood, MD [address], [place], [place], [address] [address], [place], [place], [address] Phone: [phone]  Fax: [phone]  Primary Care Physician:  Erasmo Downer, MD   Pre-Procedure History & Physical: HPI:  Katherine White is a 68 y.o. female is here for an colonoscopy.   Past Medical History:  Diagnosis Date   Hyperlipoproteinemia    Hypertension    Myocardial infarction [facility])     Past Surgical History:  Procedure Laterality Date   CARDIAC CATHETERIZATION  2011   COLONOSCOPY WITH PROPOFOL N/A 08/21/2016   Procedure: COLONOSCOPY WITH PROPOFOL;  Surgeon: Wyline Mood, MD;  Location: [facility] ENDOSCOPY;  Service: Endoscopy;  Laterality: N/A;   DILATION AND CURETTAGE OF UTERUS  1980, 1981   AFTER 2 MISCARRIAGES   FRACTURE SURGERY     LEEP  1999   SKIN CANCER EXCISION     chest and left side of bridge of nose   TONSILLECTOMY     TONSILLECTOMY AND ADENOIDECTOMY  1964    Prior to Admission medications   Medication Sig Start Date End Date Taking? Authorizing Provider  aspirin 81 MG tablet Take 1 tablet by mouth daily. 11/21/10  Yes [provider]  carvedilol (COREG) 3.125 MG tablet Take 1 tablet by mouth daily. 11/21/10  Yes [provider]  losartan (COZAAR) 25 MG tablet Take 1 tablet by mouth daily.   Yes [provider]  albuterol (PROVENTIL HFA;VENTOLIN HFA) 108 (90 Base) MCG/ACT inhaler Inhale 1-2 puffs every 4 (four) hours as needed into the lungs for wheezing or shortness of breath. 12/25/16   Margaretann Loveless, PA-C  alendronate (FOSAMAX) 70 MG tablet Take 1 tablet (70 mg total) by mouth every 7 (seven) days. Take with a full glass of water on an empty stomach. Remain in seated/upright position for 30 mins. 09/04/21   Jacky Kindle, FNP  b complex vitamins capsule Take 1 capsule by mouth daily.    [provider]  Cholecalciferol (VITAMIN D) 2000 UNITS CAPS Take 1 capsule by mouth daily.     [provider]  fluconazole (DIFLUCAN) 150 MG tablet Take 1 on first day of symptoms repeat in 4 days if symptoms remain 08/28/22   Jacky Kindle, FNP  fluticasone [facility]) 50 MCG/ACT nasal spray Place 2 sprays into both nostrils daily. 04/09/17   Margaretann Loveless, PA-C  Ivermectin 1 % CREA Apply topically once daily.    [provider]  loratadine (CLARITIN REDITABS) 10 MG dissolvable tablet Take 1 tablet by mouth as needed.    [provider]  Magnesium Citrate 100 MG TABS Take 1 tablet by mouth daily. Reported on 06/05/2015    [provider]  MOBIC 15 MG tablet Take 1 tablet every day by oral route. 12/25/21   [provider]  MULTIPLE VITAMIN PO Take 1 tablet by mouth daily. 11/21/10   [provider]  Multiple Vitamins-Minerals (VISION-VITE PRESERVE PO) Take by mouth.    [provider]  nitroGLYCERIN (NITROSTAT) 0.4 MG SL tablet Place 1 tablet under the tongue as needed. Reported on 06/05/2015 11/21/10   [provider]  sulfamethoxazole-trimethoprim (BACTRIM DS) 800-160 MG tablet Take 1 tablet by mouth 2 (two) times daily. 08/28/22   Jacky Kindle, FNP    Allergies as of 07/09/2022 - Review Complete 07/03/2022  Allergen Reaction Noted   Aspirin Other (See Comments) 10/31/2013   Codeine  08/08/2014   Codeine sulfate Other (See Comments) 10/31/2013  Family History  Problem Relation Age of Onset   Hypertension Brother    Breast cancer Maternal Aunt    Diabetes Paternal Uncle    Diabetes Maternal Grandmother    Pneumonia Paternal Grandmother    Coronary artery disease Mother    Liver cancer Mother    Heart disease Mother    Breast cancer Mother 30   Heart disease Father    Bladder Cancer Father    Heart attack Maternal Grandfather    Hyperlipidemia Other    Diabetes Other    Heart disease Other    Breast cancer Other        great aunt   Breast cancer Cousin     Social History    Socioeconomic History   Marital status: Married    Spouse name: Earl Lites   Number of children: 4   Years of education: some college   Highest education level: Not on file  Occupational History    Employer: RETIRED  Tobacco Use   Smoking status: Former    Types: Cigarettes   Smokeless tobacco: Never  Vaping Use   Vaping status: Never Used  Substance and Sexual Activity   Alcohol use: Yes    Alcohol/week: 0.0 standard drinks of alcohol    Comment: OCCASIONALLY   Drug use: No   Sexual activity: Yes  Other Topics Concern   Not on file  Social History Narrative   Not on file   Social Determinants of Health   Financial Resource Strain: Low Risk  (07/01/2022)   Overall Financial Resource Strain (CARDIA)    Difficulty of Paying Living Expenses: Not very hard  Food Insecurity: No Food Insecurity (07/01/2022)   Hunger Vital Sign    Worried About Running Out of Food in the Last Year: Never true    Ran Out of Food in the Last Year: Never true  Transportation Needs: No Transportation Needs (07/01/2022)   PRAPARE - Administrator, Civil Service (Medical): No    Lack of Transportation (Non-Medical): No  Physical Activity: Inactive (07/01/2022)   Exercise Vital Sign    Days of Exercise per Week: 0 days    Minutes of Exercise per Session: 0 min  Stress: No Stress Concern Present (07/01/2022)   [employer] of Occupational Health - Occupational Stress Questionnaire    Feeling of Stress : Not at all  Social Connections: Moderately Isolated (07/01/2022)   Social Connection and Isolation Panel [NHANES]    Frequency of Communication with Friends and Family: More than three times a week    Frequency of Social Gatherings with Friends and Family: Once a week    Attends Religious Services: Never    Database administrator or Organizations: No    Attends Banker Meetings: Never    Marital Status: Married  Catering manager Violence: Not At Risk (07/01/2022)    Humiliation, Afraid, Rape, and Kick questionnaire    Fear of Current or Ex-Partner: No    Emotionally Abused: No    Physically Abused: No    Sexually Abused: No    Review of Systems: See HPI, otherwise negative ROS  Physical Exam: BP (!) 123/59   Pulse 68   Temp (!) 97.3 F (36.3 C) (Temporal)   Resp 18   Ht 5' 0.75" (1.543 m)   Wt 70.8 kg   SpO2 100%   BMI 29.72 kg/m  General:   Alert,  pleasant and cooperative in NAD Head:  Normocephalic and atraumatic. Neck:  Supple;  no masses or thyromegaly. Lungs:  Clear throughout to auscultation, normal respiratory effort.    Heart:  +S1, +S2, Regular rate and rhythm, No edema. Abdomen:  Soft, nontender and nondistended. Normal bowel sounds, without guarding, and without rebound.   Neurologic:  Alert and  oriented x4;  grossly normal neurologically.  Impression/Plan: Katherine White is here for an colonoscopy to be performed for surveillance due to prior history of colon polyps   Risks, benefits, limitations, and alternatives regarding  colonoscopy have been reviewed with the patient.  Questions have been answered.  All parties agreeable.   Wyline Mood, MD  10/09/2022, 8:56 AM

## 2022-10-10 ENCOUNTER — Encounter: Payer: Self-pay | Admitting: Gastroenterology

## 2022-11-04 DIAGNOSIS — I209 Angina pectoris, unspecified: Secondary | ICD-10-CM | POA: Diagnosis not present

## 2022-11-04 DIAGNOSIS — I252 Old myocardial infarction: Secondary | ICD-10-CM | POA: Diagnosis not present

## 2022-11-04 DIAGNOSIS — R0789 Other chest pain: Secondary | ICD-10-CM | POA: Diagnosis not present

## 2022-11-04 DIAGNOSIS — I10 Essential (primary) hypertension: Secondary | ICD-10-CM | POA: Diagnosis not present

## 2022-11-04 DIAGNOSIS — Z8679 Personal history of other diseases of the circulatory system: Secondary | ICD-10-CM | POA: Diagnosis not present

## 2022-11-04 DIAGNOSIS — I2510 Atherosclerotic heart disease of native coronary artery without angina pectoris: Secondary | ICD-10-CM | POA: Diagnosis not present

## 2022-11-05 ENCOUNTER — Encounter: Payer: Self-pay | Admitting: Gastroenterology

## 2023-03-11 ENCOUNTER — Telehealth: Payer: Self-pay

## 2023-03-11 NOTE — Telephone Encounter (Signed)
Copied from CRM [id number]. Topic: Appointment Scheduling - Scheduling Inquiry for Clinic >> Mar 10, 2023 10:59 AM Katherine White wrote: Reason for CRM: Pt inquiring how often should menopausal women get a pap done and should she schedule a physical for labs.   Please assist pt further

## 2023-03-12 NOTE — Telephone Encounter (Signed)
She will be due for a CPE in late May.  No need for pap after 65 if previous paps normal.

## 2023-03-16 NOTE — Telephone Encounter (Signed)
We can discuss at her visit. 

## 2023-03-24 ENCOUNTER — Encounter: Payer: Self-pay | Admitting: Family Medicine

## 2023-03-24 DIAGNOSIS — M810 Age-related osteoporosis without current pathological fracture: Secondary | ICD-10-CM

## 2023-03-26 NOTE — Telephone Encounter (Signed)
Ok to send referral to endo for osteoporosis

## 2023-03-31 DIAGNOSIS — R0789 Other chest pain: Secondary | ICD-10-CM | POA: Diagnosis not present

## 2023-03-31 DIAGNOSIS — I2510 Atherosclerotic heart disease of native coronary artery without angina pectoris: Secondary | ICD-10-CM | POA: Diagnosis not present

## 2023-03-31 DIAGNOSIS — Z8679 Personal history of other diseases of the circulatory system: Secondary | ICD-10-CM | POA: Diagnosis not present

## 2023-03-31 DIAGNOSIS — R0609 Other forms of dyspnea: Secondary | ICD-10-CM | POA: Diagnosis not present

## 2023-04-10 ENCOUNTER — Encounter: Payer: Self-pay | Admitting: Family Medicine

## 2023-04-10 ENCOUNTER — Ambulatory Visit: Payer: Medicare Other | Admitting: Family Medicine

## 2023-04-10 VITALS — BP 117/57 | HR 93 | Temp 98.6°F | Resp 20 | Ht 60.75 in | Wt 165.7 lb

## 2023-04-10 DIAGNOSIS — R059 Cough, unspecified: Secondary | ICD-10-CM | POA: Diagnosis not present

## 2023-04-10 DIAGNOSIS — M810 Age-related osteoporosis without current pathological fracture: Secondary | ICD-10-CM

## 2023-04-10 DIAGNOSIS — R062 Wheezing: Secondary | ICD-10-CM | POA: Diagnosis not present

## 2023-04-10 DIAGNOSIS — J40 Bronchitis, not specified as acute or chronic: Secondary | ICD-10-CM | POA: Diagnosis not present

## 2023-04-10 DIAGNOSIS — M79671 Pain in right foot: Secondary | ICD-10-CM

## 2023-04-10 DIAGNOSIS — J0100 Acute maxillary sinusitis, unspecified: Secondary | ICD-10-CM | POA: Insufficient documentation

## 2023-04-10 DIAGNOSIS — M79672 Pain in left foot: Secondary | ICD-10-CM

## 2023-04-10 MED ORDER — ALBUTEROL SULFATE HFA 108 (90 BASE) MCG/ACT IN AERS
1.0000 | INHALATION_SPRAY | RESPIRATORY_TRACT | 0 refills | Status: DC | PRN
Start: 2023-04-10 — End: 2023-07-07

## 2023-04-10 MED ORDER — AMOXICILLIN-POT CLAVULANATE 875-125 MG PO TABS
1.0000 | ORAL_TABLET | Freq: Two times a day (BID) | ORAL | 0 refills | Status: DC
Start: 2023-04-10 — End: 2023-07-07

## 2023-04-10 MED ORDER — BENZONATATE 100 MG PO CAPS
100.0000 mg | ORAL_CAPSULE | Freq: Two times a day (BID) | ORAL | 0 refills | Status: DC | PRN
Start: 2023-04-10 — End: 2023-07-07

## 2023-04-10 MED ORDER — FLUTICASONE PROPIONATE 50 MCG/ACT NA SUSP
2.0000 | Freq: Every day | NASAL | 3 refills | Status: AC
Start: 2023-04-10 — End: ?

## 2023-04-10 NOTE — Progress Notes (Signed)
 Established patient visit   Patient: Katherine White   DOB: 09/28/1955   68 y.o. Female  MRN: [id number] Visit Date: 04/10/2023  Today's healthcare provider: Sherlyn Hay, DO   Chief Complaint  Patient presents with   Sore Throat    Began Monday, fever developed during the day (100.6)---these symptoms resolved   Nasal Congestion    Home COVID and FLU A/B all negative   Facial Pain    Sinus pain   Subjective    Sore Throat  Associated symptoms include congestion, coughing and ear pain. Pertinent negatives include no abdominal pain, shortness of breath or vomiting.    Angus Seller Montanaro "Olegario Messier" is a 68 year old female who presents with symptoms of a sinus infection and cough.  She presents with symptoms consistent with a sinus infection, including frontal sinus pain and aching in the roof of her mouth, which began on Monday. She experienced fever on Monday and part of Tuesday. Her condition is improving, though she still cannot breathe well and experiences pain in the roof of her mouth. Her eyes are runny, and her nose was both runny and congested this morning. She experiences throat drainage affecting her voice, ear pain, and a significant cough, which worsens when she is upright or moving. The cough is exhausting and affects her sleep, though she snores when she does sleep. She also reports a sore neck, particularly on the left side, likely due to coughing.  She has been using Coricidin, specifically the extra strength orange capsules, for headache and fever, which helped alleviate her headache. She is not using other medications for coughing or chest congestion, as she believes they contain similar ingredients. Her current medications include a baby aspirin, a multivitamin, and a calcium tablet with iron. She is not using Flonase due to running out and is not taking Fosamax due to unacceptable side effects. She has an inhaler, though it is old, and she has not been using  it. She is not currently taking B vitamins, carvedilol, losartan, Claritin, or vitamin D. She occasionally uses nitroglycerin and has a prescription for Preservision, though she has not taken it recently. She has experienced constipation, which she associates with acetaminophen use, and has magnesium available to help with this.  She has experienced foot pain, for which she has received cortisone shots in the left foot, and is considering further evaluation for the right foot. She notes that her foot pain seemed to improve when she previously took antibiotics for a similar illness.     Medications: Outpatient Medications Prior to Visit  Medication Sig Note   aspirin 81 MG tablet Take 1 tablet by mouth daily.    MULTIPLE VITAMIN PO Take 1 tablet by mouth daily.    Multiple Vitamins-Minerals (ONE DAILY CALCIUM/IRON PO) Take 1 capsule by mouth daily.    Magnesium Citrate 100 MG TABS Take 1 tablet by mouth daily. Reported on 06/05/2015 (Patient not taking: Reported on 04/10/2023)    Multiple Vitamins-Minerals (VISION-VITE PRESERVE PO) Take by mouth. (Patient not taking: Reported on 04/10/2023)    nitroGLYCERIN (NITROSTAT) 0.4 MG SL tablet Place 1 tablet under the tongue as needed. Reported on 06/05/2015 (Patient not taking: Reported on 04/10/2023)    [DISCONTINUED] albuterol (PROVENTIL HFA;VENTOLIN HFA) 108 (90 Base) MCG/ACT inhaler Inhale 1-2 puffs every 4 (four) hours as needed into the lungs for wheezing or shortness of breath. (Patient not taking: Reported on 04/10/2023)    [DISCONTINUED] alendronate (FOSAMAX) 70 MG tablet  Take 1 tablet (70 mg total) by mouth every 7 (seven) days. Take with a full glass of water on an empty stomach. Remain in seated/upright position for 30 mins. (Patient not taking: Reported on 04/10/2023) 04/10/2023: will be seeing Dr. Tedd Sias in April for possible alternatives   [DISCONTINUED] b complex vitamins capsule Take 1 capsule by mouth daily. (Patient not taking: Reported on  04/10/2023)    [DISCONTINUED] carvedilol (COREG) 3.125 MG tablet Take 1 tablet by mouth daily. (Patient not taking: Reported on 04/10/2023)    [DISCONTINUED] Cholecalciferol (VITAMIN D) 2000 UNITS CAPS Take 1 capsule by mouth daily. (Patient not taking: Reported on 04/10/2023)    [DISCONTINUED] fluconazole (DIFLUCAN) 150 MG tablet Take 1 on first day of symptoms repeat in 4 days if symptoms remain (Patient not taking: Reported on 04/10/2023)    [DISCONTINUED] fluticasone (FLONASE) 50 MCG/ACT nasal spray Place 2 sprays into both nostrils daily. (Patient not taking: Reported on 04/10/2023)    [DISCONTINUED] Ivermectin 1 % CREA Apply topically once daily. (Patient not taking: Reported on 04/10/2023)    [DISCONTINUED] loratadine (CLARITIN REDITABS) 10 MG dissolvable tablet Take 1 tablet by mouth as needed. (Patient not taking: Reported on 04/10/2023)    [DISCONTINUED] losartan (COZAAR) 25 MG tablet Take 1 tablet by mouth daily. (Patient not taking: Reported on 04/10/2023)    [DISCONTINUED] MOBIC 15 MG tablet Take 1 tablet every day by oral route. (Patient not taking: Reported on 04/10/2023)    [DISCONTINUED] sulfamethoxazole-trimethoprim (BACTRIM DS) 800-160 MG tablet Take 1 tablet by mouth 2 (two) times daily. (Patient not taking: Reported on 04/10/2023)    No facility-administered medications prior to visit.    Review of Systems  Constitutional:  Positive for fever. Negative for appetite change, chills and fatigue.  HENT:  Positive for congestion, ear pain, rhinorrhea, sinus pressure, sinus pain and sore throat.   Eyes:  Positive for discharge (tearing).  Respiratory:  Positive for cough. Negative for chest tightness and shortness of breath.   Cardiovascular:  Negative for chest pain and palpitations.  Gastrointestinal:  Negative for abdominal pain, nausea and vomiting.  Neurological:  Negative for dizziness and weakness.        Objective    BP (!) 117/57   Pulse 93   Temp 98.6 F (37 C)   Resp  20   Ht 5' 0.75" (1.543 m)   Wt 165 lb 11.2 oz (75.2 kg)   SpO2 100%   BMI 31.57 kg/m     Physical Exam Constitutional:      Appearance: Normal appearance. She is well-developed. She is obese.  HENT:     Head: Normocephalic and atraumatic.     Right Ear: Tympanic membrane and ear canal normal.     Left Ear: Tympanic membrane and ear canal normal.     Mouth/Throat:     Tonsils: No tonsillar exudate or tonsillar abscesses.  Eyes:     General: No scleral icterus.    Extraocular Movements: Extraocular movements intact.     Conjunctiva/sclera: Conjunctivae normal.  Cardiovascular:     Rate and Rhythm: Normal rate and regular rhythm.     Pulses: Normal pulses.     Heart sounds: Normal heart sounds.  Pulmonary:     Effort: Pulmonary effort is normal. No respiratory distress.     Breath sounds: Examination of the right-middle field reveals wheezing. Examination of the right-lower field reveals wheezing. Wheezing (right side) present.  Abdominal:     General: Bowel sounds are normal. There is no distension.  Palpations: Abdomen is soft. There is no mass.     Tenderness: There is no abdominal tenderness. There is no guarding.  Musculoskeletal:     Right lower leg: No edema.     Left lower leg: No edema.  Skin:    General: Skin is warm and dry.  Neurological:     Mental Status: She is alert and oriented to person, place, and time. Mental status is at baseline.  Psychiatric:        Mood and Affect: Mood normal.        Behavior: Behavior normal.      No results found for any visits on 04/10/23.  Assessment & Plan    Acute non-recurrent maxillary sinusitis Assessment & Plan: Acute sinusitis with symptoms including frontal and maxillary sinus pain, nasal congestion, rhinorrhea, and postnasal drip. Symptoms began on Monday with fever on Monday and part of Tuesday. Reports improvement but still experiencing significant discomfort, including palatal and ear pain. Wheezing noted on  the lower right side during examination. Likely viral etiology; typically resolves in 7-10 days. Antibiotics generally not recommended initially to avoid resistance, but amoxicillin will be prescribed if symptoms worsen or do not improve by Monday. - Send prescription for amoxicillin - Send prescription for Flonase - Send prescription for albuterol inhaler - Send prescription for [employer] - Encourage use of albuterol inhaler for breathing and cough - Advise to wait until Monday to start antibiotics unless symptoms worsen - Recommend using Flonase for nasal congestion - Advise to monitor symptoms and follow up if not improving  Orders: -     Fluticasone Propionate; Place 2 sprays into both nostrils daily.  Dispense: 16 g; Refill: 3 -     Amoxicillin-Pot Clavulanate; Take 1 tablet by mouth 2 (two) times daily.  Dispense: 14 tablet; Refill: 0  Bronchitis -     Albuterol Sulfate HFA; Inhale 1-2 puffs into the lungs every 4 (four) hours as needed for wheezing or shortness of breath.  Dispense: 1 each; Refill: 0 -     Fluticasone Propionate; Place 2 sprays into both nostrils daily.  Dispense: 16 g; Refill: 3 -     Amoxicillin-Pot Clavulanate; Take 1 tablet by mouth 2 (two) times daily.  Dispense: 14 tablet; Refill: 0 -     Benzonatate; Take 1 capsule (100 mg total) by mouth 2 (two) times daily as needed for cough.  Dispense: 20 capsule; Refill: 0  Cough -     Albuterol Sulfate HFA; Inhale 1-2 puffs into the lungs every 4 (four) hours as needed for wheezing or shortness of breath.  Dispense: 1 each; Refill: 0 -     Benzonatate; Take 1 capsule (100 mg total) by mouth 2 (two) times daily as needed for cough.  Dispense: 20 capsule; Refill: 0  Wheeze -     Albuterol Sulfate HFA; Inhale 1-2 puffs into the lungs every 4 (four) hours as needed for wheezing or shortness of breath.  Dispense: 1 each; Refill: 0  Pain in both feet  Osteoporosis, unspecified osteoporosis type, unspecified  pathological fracture presence Assessment & Plan: Osteoporosis with previous intolerance to Fosamax due to unacceptable side effects. Upcoming appointment with Dr. Rich Brave to discuss management. - Follow up with Dr. Rich Brave on April 22 for osteoporosis management   Foot Pain Chronic foot pain with history of cortisone injections in the left foot and current pain in the right foot. Off statins for a week to assess impact on foot pain; no improvement noted. Previous  x-rays showed no significant findings except for a possible bone spur. Previous antibiotic use seemed to alleviate foot pain, suggesting a possible inflammatory component. - Follow up with Dr. Excell Seltzer or Dr. Ether Griffins for further evaluation and management - Monitor response to discontinuation of statins - Monitor foot pain during antibiotic course for potential inflammation  General Health Maintenance Currently taking a multivitamin, calcium with iron, and baby aspirin. Not taking B vitamins, carvedilol, losartan, Claritin, or vitamin D. Uses nitroglycerin as needed (has not needed). - Continue current medications including multivitamin, calcium with iron, and baby aspirin - Consider magnesium supplementation for constipation if needed - Updated medication list to reflect current medications  Follow-up - Use MyChart for any questions or concerns. Return if symptoms worsen or fail to improve, for w/PCP.      I discussed the assessment and treatment plan with the patient  The patient was provided an opportunity to ask questions and all were answered. The patient agreed with the plan and demonstrated an understanding of the instructions.   The patient was advised to call back or seek an in-person evaluation if the symptoms worsen or if the condition fails to improve as anticipated.      Sherlyn Hay, DO  [facility] [phone] (phone) [phone] (fax)  [facility]

## 2023-04-10 NOTE — Assessment & Plan Note (Signed)
 Acute sinusitis with symptoms including frontal and maxillary sinus pain, nasal congestion, rhinorrhea, and postnasal drip. Symptoms began on Monday with fever on Monday and part of Tuesday. Reports improvement but still experiencing significant discomfort, including palatal and ear pain. Wheezing noted on the lower right side during examination. Likely viral etiology; typically resolves in 7-10 days. Antibiotics generally not recommended initially to avoid resistance, but amoxicillin will be prescribed if symptoms worsen or do not improve by Monday. - Send prescription for amoxicillin - Send prescription for Flonase - Send prescription for albuterol inhaler - Send prescription for [employer] - Encourage use of albuterol inhaler for breathing and cough - Advise to wait until Monday to start antibiotics unless symptoms worsen - Recommend using Flonase for nasal congestion - Advise to monitor symptoms and follow up if not improving

## 2023-04-10 NOTE — Assessment & Plan Note (Signed)
 Osteoporosis with previous intolerance to Fosamax due to unacceptable side effects. Upcoming appointment with Dr. Rich Brave to discuss management. - Follow up with Dr. Rich Brave on April 22 for osteoporosis management

## 2023-04-10 NOTE — Assessment & Plan Note (Deleted)
 Acute sinusitis with symptoms including frontal and maxillary sinus pain, nasal congestion, rhinorrhea, and postnasal drip. Symptoms began on Monday with fever on Monday and part of Tuesday. Reports improvement but still experiencing significant discomfort, including palatal and ear pain. Wheezing noted on the lower right side during examination. Likely viral etiology; typically resolves in 7-10 days. Antibiotics generally not recommended initially to avoid resistance, but amoxicillin will be prescribed if symptoms worsen or do not improve by Monday. - Send prescription for amoxicillin - Send prescription for Flonase - Send prescription for albuterol inhaler - Send prescription for [employer] - Encourage use of albuterol inhaler for breathing and cough - Advise to wait until Monday to start antibiotics unless symptoms worsen - Recommend using Flonase for nasal congestion - Advise to monitor symptoms and follow up if not improving

## 2023-04-17 ENCOUNTER — Ambulatory Visit: Payer: Self-pay | Admitting: Family Medicine

## 2023-04-17 DIAGNOSIS — J0100 Acute maxillary sinusitis, unspecified: Secondary | ICD-10-CM

## 2023-04-17 MED ORDER — AZITHROMYCIN 250 MG PO TABS
ORAL_TABLET | ORAL | 0 refills | Status: AC
Start: 2023-04-17 — End: 2023-04-22

## 2023-04-17 NOTE — Telephone Encounter (Signed)
  Chief Complaint: S/s continue Symptoms: Productive cough, HA Frequency: before 2/28 Pertinent Negatives: Patient denies Fever Disposition: [] ED /[] Urgent Care (no appt availability in office) / [] Appointment(In office/virtual)/ []  [facility]/ [] Home Care/ [] Refused Recommended Disposition /[] [facility]/ [x]  Follow-up with PCP Additional Notes: Pt was seen in office 2/28 and given several medications. Pt is improved, cough is better, however pt still has productive cough and green mucous. No OV available nor VV with [facility] provider. Pt does not want to go outside of practice. PT would like another round of ABX called in. Please advise.    Copied from CRM [id number]. Topic: Clinical - Medical Advice >> Apr 17, 2023  8:47 AM [name] wrote: Patient was seen in office by Dr. Payton Mccallum on 2/28 for sinusitis and given amoxicillin, Flonase, albuterol inhaler and Tessalon Perles. Patient states that she is on her last day of antibiotics and she still has green mucous and her cough is getting worse. Patient is requesting an additional round of antibiotics. Please advise. Reason for Disposition  [1] Taking antibiotic > 7 days AND [2] nasal discharge not improved  Answer Assessment - Initial Assessment Questions 1. ANTIBIOTIC: "What antibiotic are you taking?" "How many times a day?"     Amoxicillin 2. ONSET: "When was the antibiotic started?"     04/10/2023 3. PAIN: "How bad is the sinus pain?"   (Scale 1-10; mild, moderate or severe)   - MILD (1-3): doesn't interfere with normal activities    - MODERATE (4-7): interferes with normal activities (e.g., work or school) or awakens from sleep   - SEVERE (8-10): excruciating pain and patient unable to do any normal activities        mild 4. FEVER: "Do you have a fever?" If Yes, ask: "What is it, how was it measured, and when did it start?"      no 5. SYMPTOMS: "Are there any other symptoms you're concerned about?" If Yes, ask: "When  did it start?"     Cough still there, green mucous, HA  Protocols used: Sinus Infection on Antibiotic Follow-up Call-[facility]

## 2023-04-17 NOTE — Addendum Note (Signed)
 Addended by: Jacquenette Shone on: 04/17/2023 04:04 PM   Modules accepted: Orders

## 2023-04-21 DIAGNOSIS — K080 Exfoliation of teeth due to systemic causes: Secondary | ICD-10-CM | POA: Diagnosis not present

## 2023-05-01 DIAGNOSIS — M79671 Pain in right foot: Secondary | ICD-10-CM | POA: Diagnosis not present

## 2023-05-01 DIAGNOSIS — M79672 Pain in left foot: Secondary | ICD-10-CM | POA: Diagnosis not present

## 2023-05-01 DIAGNOSIS — M216X1 Other acquired deformities of right foot: Secondary | ICD-10-CM | POA: Diagnosis not present

## 2023-05-01 DIAGNOSIS — M7731 Calcaneal spur, right foot: Secondary | ICD-10-CM | POA: Diagnosis not present

## 2023-05-01 DIAGNOSIS — M722 Plantar fascial fibromatosis: Secondary | ICD-10-CM | POA: Diagnosis not present

## 2023-06-01 DIAGNOSIS — M79671 Pain in right foot: Secondary | ICD-10-CM | POA: Diagnosis not present

## 2023-06-01 DIAGNOSIS — M79672 Pain in left foot: Secondary | ICD-10-CM | POA: Diagnosis not present

## 2023-06-02 DIAGNOSIS — M810 Age-related osteoporosis without current pathological fracture: Secondary | ICD-10-CM | POA: Diagnosis not present

## 2023-06-11 DIAGNOSIS — M79671 Pain in right foot: Secondary | ICD-10-CM | POA: Diagnosis not present

## 2023-06-11 DIAGNOSIS — M79672 Pain in left foot: Secondary | ICD-10-CM | POA: Diagnosis not present

## 2023-06-16 DIAGNOSIS — M79671 Pain in right foot: Secondary | ICD-10-CM | POA: Diagnosis not present

## 2023-06-16 DIAGNOSIS — M79672 Pain in left foot: Secondary | ICD-10-CM | POA: Diagnosis not present

## 2023-06-24 DIAGNOSIS — M79671 Pain in right foot: Secondary | ICD-10-CM | POA: Diagnosis not present

## 2023-06-24 DIAGNOSIS — M79672 Pain in left foot: Secondary | ICD-10-CM | POA: Diagnosis not present

## 2023-06-30 DIAGNOSIS — M79672 Pain in left foot: Secondary | ICD-10-CM | POA: Diagnosis not present

## 2023-06-30 DIAGNOSIS — M79671 Pain in right foot: Secondary | ICD-10-CM | POA: Diagnosis not present

## 2023-07-07 ENCOUNTER — Ambulatory Visit (INDEPENDENT_AMBULATORY_CARE_PROVIDER_SITE_OTHER): Payer: Self-pay

## 2023-07-07 DIAGNOSIS — Z0000 Encounter for general adult medical examination without abnormal findings: Secondary | ICD-10-CM

## 2023-07-07 DIAGNOSIS — Z1231 Encounter for screening mammogram for malignant neoplasm of breast: Secondary | ICD-10-CM

## 2023-07-07 DIAGNOSIS — M79671 Pain in right foot: Secondary | ICD-10-CM | POA: Diagnosis not present

## 2023-07-07 DIAGNOSIS — Z780 Asymptomatic menopausal state: Secondary | ICD-10-CM

## 2023-07-07 DIAGNOSIS — M79672 Pain in left foot: Secondary | ICD-10-CM | POA: Diagnosis not present

## 2023-07-07 NOTE — Progress Notes (Signed)
 Subjective:   Katherine White is a 68 y.o. who presents for a Medicare Wellness preventive visit.  As a reminder, Annual Wellness Visits don't include a physical exam, and some assessments may be limited, especially if this visit is performed virtually. We may recommend an in-person follow-up visit with your provider if needed.  Visit Complete: Virtual I connected with  Katherine White on 07/07/23 by a audio enabled telemedicine application and verified that I am speaking with the correct person using two identifiers.  Patient Location: Home  Provider Location: Office/Clinic  I discussed the limitations of evaluation and management by telemedicine. The patient expressed understanding and agreed to proceed.  Vital Signs: Because this visit was a virtual/telehealth visit, some criteria may be missing or patient reported. Any vitals not documented were not able to be obtained and vitals that have been documented are patient reported.  VideoDeclined- This patient declined Librarian, academic. Therefore the visit was completed with audio only.  Persons Participating in Visit: Patient.  AWV Questionnaire: No: Patient Medicare AWV questionnaire was not completed prior to this visit.  Cardiac Risk Factors include: advanced age (>47men, >31 women);dyslipidemia;sedentary lifestyle;obesity (BMI >30kg/m2)     Objective:     Today's Vitals   07/07/23 0931  PainSc: 4    There is no height or weight on file to calculate BMI.     07/07/2023    9:40 AM 10/09/2022    8:34 AM 07/01/2022   10:10 AM 12/06/2016    9:06 AM 08/21/2016    9:26 AM 06/05/2016    9:11 AM 11/17/2014    8:11 AM  Advanced Directives  Does Patient Have a Medical Advance Directive? No No Yes No No No No  Type of Surveyor, minerals;Living will      Would patient like information on creating a medical advance directive? No - Patient declined    No - Patient  declined      Current Medications (verified) Outpatient Encounter Medications as of 07/07/2023  Medication Sig   alendronate  (FOSAMAX ) 70 MG tablet Take 70 mg by mouth once a week. Take with a full glass of water on an empty stomach.   aspirin 81 MG tablet Take 1 tablet by mouth daily.   cholecalciferol (VITAMIN D3) 25 MCG (1000 UNIT) tablet Take 2,000 Units by mouth daily.   fluticasone  (FLONASE ) 50 MCG/ACT nasal spray Place 2 sprays into both nostrils daily.   Magnesium Citrate 100 MG TABS Take 1 tablet by mouth daily. Reported on 06/05/2015   Multiple Vitamins-Minerals (ONE DAILY CALCIUM/IRON PO) Take 1 capsule by mouth daily.   Multiple Vitamins-Minerals (VISION-VITE PRESERVE PO) Take by mouth.   nitroGLYCERIN  (NITROSTAT ) 0.4 MG SL tablet Place 1 tablet under the tongue as needed. Reported on 06/05/2015   [DISCONTINUED] MULTIPLE VITAMIN PO Take 1 tablet by mouth daily.   [DISCONTINUED] albuterol  (VENTOLIN  HFA) 108 (90 Base) MCG/ACT inhaler Inhale 1-2 puffs into the lungs every 4 (four) hours as needed for wheezing or shortness of breath.   [DISCONTINUED] amoxicillin -clavulanate (AUGMENTIN ) 875-125 MG tablet Take 1 tablet by mouth 2 (two) times daily.   [DISCONTINUED] benzonatate  (TESSALON ) 100 MG capsule Take 1 capsule (100 mg total) by mouth 2 (two) times daily as needed for cough.   No facility-administered encounter medications on file as of 07/07/2023.    Allergies (verified) Aspirin, Codeine, and Codeine sulfate   History: Past Medical History:  Diagnosis Date   Hyperlipoproteinemia  Hypertension    Myocardial infarction [facility])    Past Surgical History:  Procedure Laterality Date   CARDIAC CATHETERIZATION  2011   COLONOSCOPY WITH PROPOFOL  N/A 08/21/2016   Procedure: COLONOSCOPY WITH PROPOFOL ;  Surgeon: Katherine Salaam, MD;  Location: [facility] ENDOSCOPY;  Service: Endoscopy;  Laterality: N/A;   COLONOSCOPY WITH PROPOFOL  N/A 10/09/2022   Procedure: COLONOSCOPY WITH PROPOFOL ;   Surgeon: Katherine Salaam, MD;  Location: [facility] ENDOSCOPY;  Service: Gastroenterology;  Laterality: N/A;   DILATION AND CURETTAGE OF UTERUS  1980, 1981   AFTER 2 MISCARRIAGES   FRACTURE SURGERY     LEEP  1999   POLYPECTOMY  10/09/2022   Procedure: POLYPECTOMY;  Surgeon: Katherine Salaam, MD;  Location: [facility] ENDOSCOPY;  Service: Gastroenterology;;   SKIN CANCER EXCISION     chest and left side of bridge of nose   TONSILLECTOMY     TONSILLECTOMY AND ADENOIDECTOMY  1964   Family History  Problem Relation Age of Onset   Hypertension Brother    Breast cancer Maternal Aunt    Diabetes Paternal Uncle    Diabetes Maternal Grandmother    Pneumonia Paternal Grandmother    Coronary artery disease Mother    Liver cancer Mother    Heart disease Mother    Breast cancer Mother 45   Heart disease Father    Bladder Cancer Father    Heart attack Maternal Grandfather    Hyperlipidemia Other    Diabetes Other    Heart disease Other    Breast cancer Other        great aunt   Breast cancer Cousin    Social History   Socioeconomic History   Marital status: Married    Spouse name: Katherine White   Number of children: 4   Years of education: some college   Highest education level: 12th grade  Occupational History    Employer: RETIRED  Tobacco Use   Smoking status: Former    Types: Cigarettes   Smokeless tobacco: Never  Vaping Use   Vaping status: Never Used  Substance and Sexual Activity   Alcohol use: Yes    Alcohol/week: 0.0 standard drinks of alcohol    Comment: OCCASIONALLY   Drug use: No   Sexual activity: Yes  Other Topics Concern   Not on file  Social History Narrative   Not on file   Social Drivers of Health   Financial Resource Strain: Low Risk  (07/07/2023)   Overall Financial Resource Strain (CARDIA)    Difficulty of Paying Living Expenses: Not hard at all  Food Insecurity: No Food Insecurity (07/07/2023)   Hunger Vital Sign    Worried About Running Out of Food in the Last Year: Never  true    Ran Out of Food in the Last Year: Never true  Transportation Needs: No Transportation Needs (07/07/2023)   PRAPARE - Administrator, Civil Service (Medical): No    Lack of Transportation (Non-Medical): No  Physical Activity: Inactive (07/07/2023)   Exercise Vital Sign    Days of Exercise per Week: 0 days    Minutes of Exercise per Session: 0 min  Stress: No Stress Concern Present (07/07/2023)   [employer] of Occupational Health - Occupational Stress Questionnaire    Feeling of Stress : Only a little  Recent Concern: Stress - Stress Concern Present (07/04/2023)   [employer] of Occupational Health - Occupational Stress Questionnaire    Feeling of Stress : To some extent  Social Connections: Moderately Isolated (07/07/2023)  Social Connection and Isolation Panel [NHANES]    Frequency of Communication with Friends and Family: Three times a week    Frequency of Social Gatherings with Friends and Family: Once a week    Attends Religious Services: Never    Database administrator or Organizations: No    Attends Engineer, structural: Never    Marital Status: Married    Tobacco Counseling Counseling given: Not Answered    Clinical Intake:  Pre-visit preparation completed: Yes  Pain : 0-10 Pain Score: 4  Pain Type: Chronic pain Pain Location: Foot Pain Orientation: Left, Right Pain Descriptors / Indicators: Aching, Discomfort, Constant Pain Relieving Factors: doing P.T.  Pain Relieving Factors: doing P.T.  BMI - recorded: 31.4 Nutritional Status: BMI > 30  Obese Nutritional Risks: None Diabetes: No  Lab Results  Component Value Date   HGBA1C 5.4 08/11/2019   HGBA1C 5.3 06/16/2018   HGBA1C 5.3 06/11/2017     How often do you need to have someone help you when you read instructions, pamphlets, or other written materials from your doctor or pharmacy?: 1 - Never  Interpreter Needed?: No  Information entered by :: Dellie Fergusson,  LPN   Activities of Daily Living    07/07/2023    9:42 AM 07/04/2023    7:24 PM  In your present state of health, do you have any difficulty performing the following activities:  Hearing? 0 0  Vision? 0 0  Difficulty concentrating or making decisions? 0   Walking or climbing stairs? 0 0  Dressing or bathing? 0 0  Doing errands, shopping? 0 0  Preparing Food and eating ? N N  Using the Toilet? N N  In the past six months, have you accidently leaked urine? N N  Do you have problems with loss of bowel control? N N  Managing your Medications? N N  Managing your Finances? N   Housekeeping or managing your Housekeeping? N N    Patient Care Team: Mazie Speed, MD as PCP - General (Family Medicine) Antonette Batters, MD as Consulting Physician (Cardiology) Dasher, Margette Sheldon, MD (Dermatology) Pa, [facility]  Indicate any recent Medical Services you may have received from other than [facility] providers in the past year (date may be approximate).     Assessment:    This is a routine wellness examination for Katherine White.  Hearing/Vision screen Hearing Screening - Comments:: NO AIDS Vision Screening - Comments:: READERS- PATTY VISION   Goals Addressed             This Visit's Progress    DIET - EAT MORE FRUITS AND VEGETABLES         Depression Screen     07/07/2023    9:38 AM 08/28/2022    2:50 PM 07/01/2022   10:06 AM 06/21/2021   11:53 AM 08/11/2019   10:31 AM 06/16/2018    9:23 AM 06/11/2017    9:16 AM  PHQ 2/9 Scores  PHQ - 2 Score 2 0 0 0 0 0 0  PHQ- 9 Score 3 0         Fall Risk     07/07/2023    9:41 AM 07/04/2023    7:24 PM 08/28/2022    2:50 PM 07/01/2022   10:00 AM 06/21/2021   11:54 AM  Fall Risk   Falls in the past year? 0 0 0 0 1  Number falls in past yr: 0 0 0 0 0  Injury with  Fall? 0  1 0 1  Risk for fall due to : No Fall Risks  History of fall(s) No Fall Risks History of fall(s)  Follow up Falls evaluation completed  Falls evaluation  completed Education provided;Falls prevention discussed Falls evaluation completed    MEDICARE RISK AT HOME:  Medicare Risk at Home Any stairs in or around the home?: Yes If so, are there any without handrails?: Yes Home free of loose throw rugs in walkways, pet beds, electrical cords, etc?: Yes Adequate lighting in your home to reduce risk of falls?: Yes Life alert?: No Use of a cane, walker or w/c?: No Grab bars in the bathroom?: No Shower chair or bench in shower?: Yes Elevated toilet seat or a handicapped toilet?: Yes  TIMED UP AND GO:  Was the test performed?  No  Cognitive Function: 6CIT completed        07/07/2023    9:43 AM 07/01/2022   10:20 AM  6CIT Screen  What Year? 0 points 0 points  What month? 0 points 0 points  What time? 0 points 0 points  Count back from 20 0 points 0 points  Months in reverse 0 points 0 points  Repeat phrase 0 points 0 points  Total Score 0 points 0 points    Immunizations Immunization History  Administered Date(s) Administered   Influenza Inj Mdck Quad Pf 12/03/2016, 02/02/2019   Influenza Split 10/23/2009, 11/21/2010   Influenza,inj,Quad PF,6+ Mos 11/09/2013, 11/17/2014, 03/15/2016   Influenza-Unspecified 12/03/2016, 02/02/2019   PFIZER(Purple Top)SARS-COV-2 Vaccination 05/11/2019, 06/01/2019   PNEUMOCOCCAL CONJUGATE-20 06/21/2021   Tdap 03/04/2006, 12/29/2013   Zoster Recombinant(Shingrix) 10/06/2017, 04/07/2018   Zoster, Live 04/10/2011    Screening Tests Health Maintenance  Topic Date Due   COVID-19 Vaccine (3 - Pfizer risk series) 06/29/2019   DEXA SCAN  09/05/2023   MAMMOGRAM  09/09/2023   INFLUENZA VACCINE  09/11/2023   DTaP/Tdap/Td (3 - Td or Tdap) 12/30/2023   Medicare Annual Wellness (AWV)  07/06/2024   Colonoscopy  10/09/2027   Pneumonia Vaccine 30+ Years old  Completed   Hepatitis C Screening  Completed   Zoster Vaccines- Shingrix  Completed   HPV VACCINES  Aged Out   Meningococcal B Vaccine  Aged Out     Health Maintenance  Health Maintenance Due  Topic Date Due   COVID-19 Vaccine (3 - Pfizer risk series) 06/29/2019   Health Maintenance Items Addressed: Mammogram ordered; DEXA ORDERED, COLONOSCOPY UP TO DATE; SHOTS UP TO DATE- WANTS NO MORE COVID SHOTS  Additional Screening:  Vision Screening: Recommended annual ophthalmology exams for early detection of glaucoma and other disorders of the eye.  Dental Screening: Recommended annual dental exams for proper oral hygiene  Community Resource Referral / Chronic Care Management: CRR required this visit?  No   CCM required this visit?  No   Plan:    I have personally reviewed and noted the following in the patient's chart:   Medical and social history Use of alcohol, tobacco or illicit drugs  Current medications and supplements including opioid prescriptions. Patient is not currently taking opioid prescriptions. Functional ability and status Nutritional status Physical activity Advanced directives List of other physicians Hospitalizations, surgeries, and ER visits in previous 12 months Vitals Screenings to include cognitive, depression, and falls Referrals and appointments  In addition, I have reviewed and discussed with patient certain preventive protocols, quality metrics, and best practice recommendations. A written personalized care plan for preventive services as well as general preventive health recommendations were provided  to patient.   Pinky Bright, LPN   1/61/0960   After Visit Summary: (MyChart) Due to this being a telephonic visit, the after visit summary with patients personalized plan was offered to patient via MyChart   Notes: MAMMOGRAM & BONE DENSITY REFERRALS SENT

## 2023-07-07 NOTE — Patient Instructions (Addendum)
 Katherine White , Thank you for taking time out of your busy schedule to complete your Annual Wellness Visit with me. I enjoyed our conversation and look forward to speaking with you again next year. I, as well as your care team,  appreciate your ongoing commitment to your health goals. Please review the following plan we discussed and let me know if I can assist you in the future.  Referrals: If you haven't heard from the office you've been referred to, please reach out to them at the phone provided.  You have an order for:  []   2D Mammogram  [x]   3D Mammogram  [x]   Bone Density     Please call for appointment:  [facility]  [address] [place] [address] [phone] [facility] [address] [place], [place] [address] [phone] [facility] [address] Tracey Friday [place], [place] [address] [phone]   Make sure to wear two-piece clothing.  No lotions, powders, or deodorants the day of the appointment. Make sure to bring picture ID and insurance card.  Bring list of medications you are currently taking including any supplements.   Schedule your [place] screening mammogram through MyChart!   Log into your MyChart account.  Go to 'Visit' (or 'Appointments' if on mobile App) --> Schedule an Appointment  Under 'Select a Reason for Visit' choose the Mammogram Screening option.  Complete the pre-visit questions and select the time and place that best fits your schedule.   Follow up Visits: Next Medicare AWV with our clinical staff:   07/13/24 @ 9:30 AM BY PHONE Have you seen your provider in the last 6 months (3 months if uncontrolled diabetes)? Yes   Clinician Recommendations:  Aim for 30 minutes of exercise or brisk walking, 6-8 glasses of water, and 5 servings of fruits and vegetables each day. TAKE CARE!!      This is a list of the screening  recommended for you and due dates:  Health Maintenance  Topic Date Due   COVID-19 Vaccine (3 - Pfizer risk series) 06/29/2019   DEXA scan (bone density measurement)  09/05/2023   Mammogram  09/09/2023   Flu Shot  09/11/2023   DTaP/Tdap/Td vaccine (3 - Td or Tdap) 12/30/2023   Medicare Annual Wellness Visit  07/06/2024   Colon Cancer Screening  10/09/2027   Pneumonia Vaccine  Completed   Hepatitis C Screening  Completed   Zoster (Shingles) Vaccine  Completed   HPV Vaccine  Aged Out   Meningitis B Vaccine  Aged Out    Advanced directives: (ACP Link)Information on Advanced Care Planning can be found at [place]  Secretary of State Advance Health Care Directives Advance Health Care Directives. [email or web address]  Advance Care Planning is important because it:  [x]  Makes sure you receive the medical care that is consistent with your values, goals, and preferences  [x]  It provides guidance to your family and loved ones and reduces their decisional burden about whether or not they are making the right decisions based on your wishes.  Follow the link provided in your after visit summary or read over the paperwork we have mailed to you to help you started getting your Advance Directives in place. If you need assistance in completing these, please reach out to us  so that we can help you!

## 2023-07-09 DIAGNOSIS — E559 Vitamin D deficiency, unspecified: Secondary | ICD-10-CM | POA: Diagnosis not present

## 2023-07-14 DIAGNOSIS — M79671 Pain in right foot: Secondary | ICD-10-CM | POA: Diagnosis not present

## 2023-07-14 DIAGNOSIS — M79672 Pain in left foot: Secondary | ICD-10-CM | POA: Diagnosis not present

## 2023-07-15 DIAGNOSIS — E559 Vitamin D deficiency, unspecified: Secondary | ICD-10-CM | POA: Diagnosis not present

## 2023-07-15 DIAGNOSIS — M810 Age-related osteoporosis without current pathological fracture: Secondary | ICD-10-CM | POA: Diagnosis not present

## 2023-07-21 DIAGNOSIS — M79672 Pain in left foot: Secondary | ICD-10-CM | POA: Diagnosis not present

## 2023-07-21 DIAGNOSIS — M79671 Pain in right foot: Secondary | ICD-10-CM | POA: Diagnosis not present

## 2023-07-28 DIAGNOSIS — M79671 Pain in right foot: Secondary | ICD-10-CM | POA: Diagnosis not present

## 2023-07-28 DIAGNOSIS — M79672 Pain in left foot: Secondary | ICD-10-CM | POA: Diagnosis not present

## 2023-08-04 DIAGNOSIS — M79671 Pain in right foot: Secondary | ICD-10-CM | POA: Diagnosis not present

## 2023-08-04 DIAGNOSIS — M79672 Pain in left foot: Secondary | ICD-10-CM | POA: Diagnosis not present

## 2023-08-18 DIAGNOSIS — M79672 Pain in left foot: Secondary | ICD-10-CM | POA: Diagnosis not present

## 2023-08-18 DIAGNOSIS — M79671 Pain in right foot: Secondary | ICD-10-CM | POA: Diagnosis not present

## 2023-08-24 ENCOUNTER — Other Ambulatory Visit: Payer: Self-pay

## 2023-08-24 ENCOUNTER — Emergency Department

## 2023-08-24 ENCOUNTER — Emergency Department
Admission: EM | Admit: 2023-08-24 | Discharge: 2023-08-24 | Disposition: A | Discharge status: Home / Self Care | Attending: Emergency Medicine | Admitting: Emergency Medicine

## 2023-08-24 DIAGNOSIS — R519 Headache, unspecified: Secondary | ICD-10-CM | POA: Diagnosis not present

## 2023-08-24 DIAGNOSIS — K810 Acute cholecystitis: Secondary | ICD-10-CM | POA: Diagnosis not present

## 2023-08-24 DIAGNOSIS — R0789 Other chest pain: Secondary | ICD-10-CM | POA: Diagnosis not present

## 2023-08-24 DIAGNOSIS — R000 Tachycardia, unspecified: Secondary | ICD-10-CM | POA: Diagnosis not present

## 2023-08-24 DIAGNOSIS — R457 State of emotional shock and stress, unspecified: Secondary | ICD-10-CM | POA: Diagnosis not present

## 2023-08-24 DIAGNOSIS — I10 Essential (primary) hypertension: Secondary | ICD-10-CM | POA: Diagnosis not present

## 2023-08-24 DIAGNOSIS — K8020 Calculus of gallbladder without cholecystitis without obstruction: Secondary | ICD-10-CM | POA: Diagnosis not present

## 2023-08-24 DIAGNOSIS — R079 Chest pain, unspecified: Secondary | ICD-10-CM | POA: Diagnosis not present

## 2023-08-24 LAB — BASIC METABOLIC PANEL WITH GFR
Anion gap: 8 (ref 5–15)
BUN: 19 mg/dL (ref 8–23)
CO2: 18 mmol/L — ABNORMAL LOW (ref 22–32)
Calcium: 8.9 mg/dL (ref 8.9–10.3)
Chloride: 113 mmol/L — ABNORMAL HIGH (ref 98–111)
Creatinine, Ser: 0.75 mg/dL (ref 0.44–1.00)
GFR, Estimated: >60 mL/min (ref >60)
Glucose, Bld: 107 mg/dL — ABNORMAL HIGH (ref 70–99)
Potassium: 3.7 mmol/L (ref 3.5–5.1)
Sodium: 139 mmol/L (ref 135–145)

## 2023-08-24 LAB — TROPONIN I (HIGH SENSITIVITY)
Troponin I (High Sensitivity): 2 ng/L (ref <18)
Troponin I (High Sensitivity): 3 ng/L (ref <18)

## 2023-08-24 LAB — CBC
HCT: 44.2 % (ref 36.0–46.0)
Hemoglobin: 14.9 g/dL (ref 12.0–15.0)
MCH: 28.6 pg (ref 26.0–34.0)
MCHC: 33.7 g/dL (ref 30.0–36.0)
MCV: 84.8 fL (ref 80.0–100.0)
Platelets: 281 K/uL (ref 150–400)
RBC: 5.21 MIL/uL — ABNORMAL HIGH (ref 3.87–5.11)
RDW: 13.7 % (ref 11.5–15.5)
WBC: 8.7 K/uL (ref 4.0–10.5)
nRBC: 0 % (ref 0.0–0.2)

## 2023-08-24 LAB — LIPASE, BLOOD: Lipase: 33 U/L (ref 11–51)

## 2023-08-24 MED ORDER — IOHEXOL 350 MG/ML SOLN
75.0000 mL | Freq: Once | INTRAVENOUS | Status: AC | PRN
Start: 2023-08-24 — End: 2023-08-24
  Administered 2023-08-24: 75 mL via INTRAVENOUS

## 2023-08-24 NOTE — Discharge Instructions (Signed)
 Please follow-up with your cardiology team outpatient.  If there is anything unusual on your labs, I will give you a call for follow-up.

## 2023-08-24 NOTE — ED Provider Notes (Signed)
 [facility] Provider Note    Event Date/Time   First MD Initiated Contact with Patient 08/24/23 1628     (approximate)   History   Chest Pain   HPI  Katherine White is a 68 y.o. female history of hypertension, hyperlipoproteinemia, MI presents emergency department with a sharp searing chest pain.  Patient states it was like and explosive type pain that spread across her chest.  Same symptoms when she had her first MI.  Does not feel short of breath.  Had no diaphoresis.  Patient also had a headache last night and could hear for heartbeat in her ear.  States has had headaches before but never one like this.  Denies fever, chills, cough or congestion.  No swelling in her legs.  Patient did stop taking her cholesterol medicine about 6 months ago.      Physical Exam   Triage Vital Signs: ED Triage Vitals [08/24/23 1319]  Encounter Vitals Group     BP 130/72     Girls Systolic BP Percentile      Girls Diastolic BP Percentile      Boys Systolic BP Percentile      Boys Diastolic BP Percentile      Pulse Rate 96     Resp 18     Temp 98.9 F (37.2 C)     Temp Source Oral     SpO2 96 %     Weight 160 lb (72.6 kg)     Height 5' (1.524 m)     Head Circumference      Peak Flow      Pain Score 0     Pain Loc      Pain Education      Exclude from Growth Chart     Most recent vital signs: Vitals:   08/24/23 1319 08/24/23 1741  BP: 130/72   Pulse: 96   Resp: 18   Temp: 98.9 F (37.2 C) 98.7 F (37.1 C)  SpO2: 96%      General: Awake, no distress.   CV:  Good peripheral perfusion. regular rate and  rhythm Resp:  Normal effort. Lungs cta Abd:  No distention.   Other:     ED Results / Procedures / Treatments   Labs (all labs ordered are listed, but only abnormal results are displayed) Labs Reviewed  BASIC METABOLIC PANEL WITH GFR - Abnormal; Notable for the following components:      Result Value   Chloride 113 (*)    CO2 18 (*)     Glucose, Bld 107 (*)    All other components within normal limits  CBC - Abnormal; Notable for the following components:   RBC 5.21 (*)    All other components within normal limits  LIPOPROTEIN A (LPA)  LIPASE, BLOOD  TROPONIN I (HIGH SENSITIVITY)  TROPONIN I (HIGH SENSITIVITY)     EKG  EKG   RADIOLOGY Chest x-ray, CT head, CTA chest abdomen pelvis    PROCEDURES:   Procedures  Critical Care:  no Chief Complaint  Patient presents with   Chest Pain      MEDICATIONS ORDERED IN ED: Medications  iohexol  (OMNIPAQUE ) 350 MG/ML injection 75 mL (75 mLs Intravenous Contrast Given 08/24/23 1724)     IMPRESSION / MDM / ASSESSMENT AND PLAN / ED COURSE  I reviewed the triage vital signs and the nursing notes.  Differential diagnosis includes, but is not limited to, CVA, AAA, MI, esophagitis, PE  Patient's presentation is most consistent with acute illness / injury with system symptoms.    Labs are reassuring with first troponin being normal  Chest x-ray independently reviewed interpreted by me as being negative for any acute abnormality  CT of the head due to the headache, CTA chest abdomen pelvis for dissection   CT of the head independently reviewed interpreted by me as being negative for any acute abnormality, CTA for chest abdomen pelvis for dissection independently reviewed interpreted by me as being negative for acute abnormality  Therefore patient is not dissecting, has not had a subdural, and the first troponin being negative will repeat second troponin to ensure she is not having an MI.  On the CTA it does show cholelithiasis.  Did explain to the patient that sometimes the gallbladder can cause the same issues to make you feel that you are having a heart attack.  Will assess lipase to see if it is elevated, she can follow-up with her doctor tomorrow for evaluation of the gallbladder.  She is in agreement with treatment plan.  Care is  transferred to Dr. Malvina at shift change.  He is to assess second Trope and lipase, consider admission if needed and discharge if not   FINAL CLINICAL IMPRESSION(S) / ED DIAGNOSES   Final diagnoses:  Atypical chest pain  Cholelithiasis without cholecystitis     Rx / DC Orders   ED Discharge Orders     None        Note:  This document was prepared using Dragon voice recognition software and may include unintentional dictation errors.    Gasper Devere ORN, PA-C 08/24/23 1844    Malvina Alm DASEN, MD 08/24/23 2049

## 2023-08-24 NOTE — ED Triage Notes (Signed)
 Pt to ED from home with explosive incident of CP around 1215 today for roughly 15 minutes following a bowel movement. Pt denies CP/SOB/dizziness at present.   Took 1 subling Nitroglycerin  tab around 1220

## 2023-08-25 ENCOUNTER — Encounter: Payer: Self-pay | Admitting: Family Medicine

## 2023-08-25 ENCOUNTER — Ambulatory Visit (INDEPENDENT_AMBULATORY_CARE_PROVIDER_SITE_OTHER): Admitting: Family Medicine

## 2023-08-25 VITALS — BP 107/74 | HR 92 | Resp 16 | Ht 60.0 in | Wt 164.4 lb

## 2023-08-25 DIAGNOSIS — I5181 Takotsubo syndrome: Secondary | ICD-10-CM | POA: Diagnosis not present

## 2023-08-25 DIAGNOSIS — K8020 Calculus of gallbladder without cholecystitis without obstruction: Secondary | ICD-10-CM | POA: Diagnosis not present

## 2023-08-25 DIAGNOSIS — M810 Age-related osteoporosis without current pathological fracture: Secondary | ICD-10-CM | POA: Diagnosis not present

## 2023-08-25 DIAGNOSIS — R4689 Other symptoms and signs involving appearance and behavior: Secondary | ICD-10-CM | POA: Diagnosis not present

## 2023-08-25 DIAGNOSIS — I25118 Atherosclerotic heart disease of native coronary artery with other forms of angina pectoris: Secondary | ICD-10-CM

## 2023-08-25 NOTE — Progress Notes (Signed)
 Acute visit   Patient: Katherine White   DOB: 05-Sep-1955   68 y.o. Female  MRN: [id number] PCP: Myrla Jon HERO, MD   Chief Complaint  Patient presents with   Breast Problem    Feels like L breast is bigger then R. States of pin point in R breast onset 1 month. Like to discuss to go ahead and schedule mammogram.   Follow-up    Went to ER 08/24/23 pt thought possible heart attack but it was gallbladder?    Subjective    Discussed the use of AI scribe software for clinical note transcription with the patient, who gave verbal consent to proceed.  History of Present Illness   Katherine White is a 68 year old female with cardiomyopathy who presents with breast concerns and recent ER visit for chest pain.  She is concerned about her breasts, noting a previous enlargement of the left breast and a deep, sharp pain in the right breast. No nodules or lumps have been detected. The pain in the right breast has resolved, and the asymmetry in the left breast has settled.  She visited the ER yesterday for severe chest pain that began suddenly at home. The pain was described as a 'tremor' escalating to an 'explosion' and 'lightning bolt' sensation, causing her to fall to her knees. Nitroglycerin  was taken without relief, and the pain persisted for about ten minutes. EMS transported her to the hospital, where a comprehensive workup was performed, revealing incidental gallstones. She has cardiomyopathy and a family history of heart disease. No current shortness of breath or dizziness.        Review of Systems  Objective    BP 107/74 (BP Location: Right Arm, Patient Position: Sitting, Cuff Size: Large)   Pulse 92   Resp 16   Ht 5' (1.524 m)   Wt 164 lb 6.4 oz (74.6 kg)   BMI 32.11 kg/m  Physical Exam Vitals reviewed.  Constitutional:      General: She is not in acute distress.    Appearance: Normal appearance. She is well-developed. She is not diaphoretic.  HENT:      Head: Normocephalic and atraumatic.  Eyes:     General: No scleral icterus.    Conjunctiva/sclera: Conjunctivae normal.  Neck:     Thyroid : No thyromegaly.  Cardiovascular:     Rate and Rhythm: Normal rate and regular rhythm.     Heart sounds: Normal heart sounds. No murmur heard. Pulmonary:     Effort: Pulmonary effort is normal. No respiratory distress.     Breath sounds: Normal breath sounds. No wheezing, rhonchi or rales.  Chest:     Comments: Breasts: breasts appear normal, no suspicious masses, no skin or nipple changes or axillary nodes  Musculoskeletal:     Cervical back: Neck supple.     Right lower leg: No edema.     Left lower leg: No edema.  Lymphadenopathy:     Cervical: No cervical adenopathy.  Skin:    General: Skin is warm and dry.     Findings: No rash.  Neurological:     Mental Status: She is alert and oriented to person, place, and time. Mental status is at baseline.  Psychiatric:        Mood and Affect: Mood normal.        Behavior: Behavior normal.       No results found for any visits on 08/25/23.  Assessment & Plan  Problem List Items Addressed This Visit       Cardiovascular and Mediastinum   Coronary artery disease of native artery of native heart with stable angina pectoris (HCC)   F/b Dr Florencio at [facility]      Takotsubo cardiomyopathy   H/o F/b [facility]        Musculoskeletal and Integument   Osteoporosis   Other Visit Diagnoses       Calculus of gallbladder without cholecystitis without obstruction    -  Primary     Concern about appearance of breast               Gallstones without cholecystitis or obstruction Recent episode of severe chest and abdominal pain likely due to gallbladder contraction on gallstones. CT scan revealed multiple dependent gallstones without evidence of cholecystitis or ductal obstruction. Liver function tests normal. No urgent intervention required. - Discuss potential referral  to a surgeon for gallbladder removal with husband. - Send MyChart message if decision is made to proceed with surgical consultation. - Provide information about gallstones.  Cardiomyopathy Recent ER visit for severe chest pain. Extensive cardiac workup including EKG, troponins, and CT scans ruled out acute cardiac events. Symptoms attributed to gallstones. - Follow up with cardiologist, Dr. Florencio, regarding medications and recent ER visit.  Plantar fasciitis, right foot Chronic plantar fasciitis in right foot with significant improvement from physical therapy and orthotics. Severe pain now managed with ongoing therapy.  Breast pain and asymmetry, resolved Reported left breast enlargement and right breast pinpoint pain, both resolved. No palpable lumps or asymmetry on examination. - Proceed with screening mammogram. - Schedule bone density scan concurrently with mammogram.       No orders of the defined types were placed in this encounter.    Return in about 3 months (around 11/25/2023) for CPE.      Jon Eva, MD  [facility] [phone] (phone) [phone] (fax)  [facility]

## 2023-08-25 NOTE — Assessment & Plan Note (Signed)
 F/b Dr Florencio at [facility]

## 2023-08-25 NOTE — Assessment & Plan Note (Signed)
 H/o F/b [facility]

## 2023-08-26 DIAGNOSIS — D2261 Melanocytic nevi of right upper limb, including shoulder: Secondary | ICD-10-CM | POA: Diagnosis not present

## 2023-08-26 DIAGNOSIS — M79671 Pain in right foot: Secondary | ICD-10-CM | POA: Diagnosis not present

## 2023-08-26 DIAGNOSIS — M79672 Pain in left foot: Secondary | ICD-10-CM | POA: Diagnosis not present

## 2023-08-26 DIAGNOSIS — D2272 Melanocytic nevi of left lower limb, including hip: Secondary | ICD-10-CM | POA: Diagnosis not present

## 2023-08-26 DIAGNOSIS — D2262 Melanocytic nevi of left upper limb, including shoulder: Secondary | ICD-10-CM | POA: Diagnosis not present

## 2023-08-26 DIAGNOSIS — D225 Melanocytic nevi of trunk: Secondary | ICD-10-CM | POA: Diagnosis not present

## 2023-09-01 ENCOUNTER — Other Ambulatory Visit: Payer: Self-pay | Admitting: Nurse Practitioner

## 2023-09-01 ENCOUNTER — Ambulatory Visit: Payer: Self-pay | Admitting: Nurse Practitioner

## 2023-09-01 ENCOUNTER — Ambulatory Visit (INDEPENDENT_AMBULATORY_CARE_PROVIDER_SITE_OTHER): Admitting: Nurse Practitioner

## 2023-09-01 ENCOUNTER — Ambulatory Visit
Admission: RE | Admit: 2023-09-01 | Discharge: 2023-09-01 | Disposition: A | Discharge status: Home / Self Care | Source: Ambulatory Visit | Attending: Nurse Practitioner

## 2023-09-01 ENCOUNTER — Ambulatory Visit: Payer: Self-pay | Admitting: *Deleted

## 2023-09-01 ENCOUNTER — Ambulatory Visit
Admission: RE | Admit: 2023-09-01 | Discharge: 2023-09-01 | Disposition: A | Discharge status: Home / Self Care | Attending: Nurse Practitioner | Admitting: Nurse Practitioner

## 2023-09-01 ENCOUNTER — Encounter: Payer: Self-pay | Admitting: Nurse Practitioner

## 2023-09-01 VITALS — BP 126/80 | HR 94 | Resp 16 | Ht 60.0 in | Wt 164.0 lb

## 2023-09-01 DIAGNOSIS — M79641 Pain in right hand: Secondary | ICD-10-CM | POA: Insufficient documentation

## 2023-09-01 DIAGNOSIS — M25531 Pain in right wrist: Secondary | ICD-10-CM | POA: Diagnosis not present

## 2023-09-01 MED ORDER — CELECOXIB 100 MG PO CAPS: 100.0000 mg | ORAL_CAPSULE | Freq: Two times a day (BID) | ORAL | 0 refills | Status: DC

## 2023-09-01 MED ORDER — CELECOXIB 100 MG PO CAPS: 100.0000 mg | ORAL_CAPSULE | Freq: Two times a day (BID) | ORAL | 0 refills | Status: AC

## 2023-09-01 NOTE — Telephone Encounter (Signed)
 Reason for Disposition  [1] SEVERE pain (e.g., excruciating, unable to use hand at all) AND [2] not improved after 2 hours of pain medicine  Answer Assessment - Initial Assessment Questions 1. ONSET: When did the pain start?     My right hand is painful and swollen.   No contrast  was put into it.   I had a CT scan done last Monday.  I went to the ED via ambulance.   I thought I was having a heart attack.   I did have a heart event.  Broken heart syndrome  2. LOCATION: Where is the pain located?     Right hand. I also have gallstones on the CT scan.      I went home drank water according to the instructions.   I saw Dr. Myrla.    This morning my wrist was hurting and the center of my hand yesterday it started.  This morning woke up 5:30 AM my hand is swollen.  I can't close it very well.   The top of my hand and my wrist feel swollen.   If I put my hand down at my side the pain is a 10/10.   If I hold it up above my heart it's better but still very painful.    3. PAIN: How bad is the pain? (Scale 1-10; or mild, moderate, severe)     See above I don't know if this is a side effect.    My IV was in my left arm and the contrast was put in this.   My vein is left elbow area of my arm is bruised where the paramedic tried to get an IV. I've seen Dr. Myrla.   4. WORK OR EXERCISE: Has there been any recent work or exercise that involved this part (i.e., hand or wrist) of the body?     See above 5. CAUSE: What do you think is causing the pain?     I don't know 6. AGGRAVATING FACTORS: What makes the pain worse? (e.g., using computer)     Letting my hand hand down below my heart is very painful. 7. OTHER SYMPTOMS: Do you have any other symptoms? (e.g., fever, neck pain, numbness or tingling, rash, swelling)     Swelling of hand but not fingers. 8. PREGNANCY: Is there any chance you are pregnant? When was your last menstrual period?     N/A due to age  Protocols used:  Hand Pain-A-AH FYI Only or Action Required?: Action required by provider: request for appointment.  Patient was last seen in primary care on 08/25/2023 by Myrla Jon HERO, MD.  Called Nurse Triage reporting Hand Pain. Right hand is swollen and very painful.  Started yesterday.  Did not have an IV in right arm during recent ED visit and CT scan.  Not sure what is going on with her hand.  Symptoms began yesterday. This morning her right hand is very painful.   Interventions attempted: Other: Keeping it elevated above her heart.  Very painful when hanging down by her side..  Symptoms are: rapidly worsening.  Triage Disposition: See HCP Within 4 Hours (Or PCP Triage)  Patient/caregiver understands and will follow disposition?: Yes She really prefers to see Dr Myrla.  Is it possible she can be worked in today?   She will see another provider if Dr. KATHEE. Can't work her in today but strongly prefers to see her.    High priority message sent to the practice.  She called prior to the office opening.

## 2023-09-01 NOTE — Progress Notes (Signed)
 BP 126/80   Pulse 94   Resp 16   Ht 5' (1.524 m)   Wt 164 lb (74.4 kg)   SpO2 98%   BMI 32.03 kg/m    Subjective:    Patient ID: Katherine White, female    DOB: 1955-12-19, 68 y.o.   MRN: [id number]  HPI: Katherine White is a 68 y.o. female  Chief Complaint  Patient presents with   Hand Pain    R, swollen x1 day.    Discussed the use of AI scribe software for clinical note transcription with the patient, who gave verbal consent to proceed.  History of Present Illness [name] is a 68 year old female who presents with acute hand pain and swelling.  Acute hand pain and swelling - Onset of pain and swelling in and around the hand joint since yesterday - Pain initially attributed to weather changes - Woke this morning with hand feeling swollen and painful, described as 'detaching almost' - Pain is significant, impairing ability to use the hand - Pain radiates from the thumb area and makes it difficult to make a fist - No recent trauma or overuse of the hand - Pain is throbbing and persistent despite elevation - No oral medication taken for current pain; applied Voltaren gel - History of arthritis in the affected joint - No history of gout  Medication use and allergies - Takes 81 mg baby aspirin daily - Occasionally uses ibuprofen for pain - Allergic to codeine and high-dose aspirin - Meloxicam causes headaches - No prior use of Celebrex           08/25/2023    8:29 AM 07/07/2023    9:38 AM 08/28/2022    2:50 PM  Depression screen PHQ 2/9  Decreased Interest 0 1 0  Down, Depressed, Hopeless 1 1 0  PHQ - 2 Score 1 2 0  Altered sleeping 1 0 0  Tired, decreased energy 1 1 0  Change in appetite 0 0 0  Feeling bad or failure about yourself  0 0 0  Trouble concentrating 1 0 0  Moving slowly or fidgety/restless 0 0 0  Suicidal thoughts 0 0 0  PHQ-9 Score 4 3 0  Difficult doing work/chores  Not difficult at all Not difficult at all     Relevant past medical, surgical, family and social history reviewed and updated as indicated. Interim medical history since our last visit reviewed. Allergies and medications reviewed and updated.  Review of Systems  Ten systems reviewed and is negative except as mentioned in HPI      Objective:     BP 126/80   Pulse 94   Resp 16   Ht 5' (1.524 m)   Wt 164 lb (74.4 kg)   SpO2 98%   BMI 32.03 kg/m    Wt Readings from Last 3 Encounters:  09/01/23 164 lb (74.4 kg)  08/25/23 164 lb 6.4 oz (74.6 kg)  08/24/23 160 lb (72.6 kg)    Physical Exam Physical Exam GENERAL: Alert, cooperative, well developed, no acute distress HEENT: Normocephalic, normal oropharynx, moist mucous membranes CHEST: Clear to auscultation bilaterally, No wheezes, rhonchi, or crackles CARDIOVASCULAR: Normal heart rate and rhythm, S1 and S2 normal without murmurs ABDOMEN: Soft, non-tender, non-distended, without organomegaly, Normal bowel sounds EXTREMITIES: No cyanosis or edema, right wrist and hand tenderness, slight swelling noted by thumb NEUROLOGICAL: Cranial nerves grossly intact, Moves all extremities without gross motor or sensory deficit   Results for  orders placed or performed during the hospital encounter of 08/24/23  Lipase, blood   Collection Time: 08/24/23  1:04 PM  Result Value Ref Range   Lipase 33 11 - 51 U/L  Basic metabolic panel   Collection Time: 08/24/23  1:25 PM  Result Value Ref Range   Sodium 139 135 - 145 mmol/L   Potassium 3.7 3.5 - 5.1 mmol/L   Chloride 113 (H) 98 - 111 mmol/L   CO2 18 (L) 22 - 32 mmol/L   Glucose, Bld 107 (H) 70 - 99 mg/dL   BUN 19 8 - 23 mg/dL   Creatinine, Ser 9.24 0.44 - 1.00 mg/dL   Calcium 8.9 8.9 - 89.6 mg/dL   GFR, Estimated >39 >39 mL/min   Anion gap 8 5 - 15  CBC   Collection Time: 08/24/23  1:25 PM  Result Value Ref Range   WBC 8.7 4.0 - 10.5 K/uL   RBC 5.21 (H) 3.87 - 5.11 MIL/uL   Hemoglobin 14.9 12.0 - 15.0 g/dL   HCT 55.7 63.9 -  53.9 %   MCV 84.8 80.0 - 100.0 fL   MCH 28.6 26.0 - 34.0 pg   MCHC 33.7 30.0 - 36.0 g/dL   RDW 86.2 88.4 - 84.4 %   Platelets 281 150 - 400 K/uL   nRBC 0.0 0.0 - 0.2 %  Troponin I (High Sensitivity)   Collection Time: 08/24/23  1:25 PM  Result Value Ref Range   Troponin I (High Sensitivity) 2 <18 ng/L  Troponin I (High Sensitivity)   Collection Time: 08/24/23  8:02 PM  Result Value Ref Range   Troponin I (High Sensitivity) 3 <18 ng/L          Assessment & Plan:   Problem List Items Addressed This Visit   None Visit Diagnoses       Right wrist pain    -  Primary   Relevant Medications   celecoxib  (CELEBREX ) 100 MG capsule   Other Relevant Orders   CBC with Differential/Platelet   Sedimentation rate   C-reactive protein   Uric acid   DG Hand Complete Right (Completed)   DG Wrist Complete Right (Completed)     Right hand pain       Relevant Medications   celecoxib  (CELEBREX ) 100 MG capsule   Other Relevant Orders   CBC with Differential/Platelet   Sedimentation rate   C-reactive protein   Uric acid   DG Hand Complete Right (Completed)   DG Wrist Complete Right (Completed)        Assessment and Plan Assessment & Plan Acute right hand and wrist pain and swelling Acute onset of right hand and wrist pain with swelling, described as throbbing and severe, impairing hand function. Pain radiates from the thumb area with associated swelling. No history of trauma or injury. Previous ER visit ruled out cardiac causes for recent symptoms, and gallstones were identified but are unrelated to current hand symptoms. Celebrex  was chosen for pain management due to adverse reactions to codeine and high-dose aspirin, and potential headache with meloxicam. - Order blood work to rule out septic arthritis. - Order x-ray of the right hand and wrist to assess for underlying structural issues. - Prescribe Celebrex  to be taken twice daily for pain management. - Instruct to elevate the  hand to reduce swelling. - Advise to go to the x-ray facility across the street for imaging. - Plan to review x-ray results today and blood work results tomorrow.  Follow up plan: Return if symptoms worsen or fail to improve.

## 2023-09-01 NOTE — Telephone Encounter (Signed)
 Patient was seen by Mliss Spray today.

## 2023-09-01 NOTE — Telephone Encounter (Signed)
 Does anyone have openings today and can see an acute?  I do not have any openings

## 2023-09-02 DIAGNOSIS — M109 Gout, unspecified: Secondary | ICD-10-CM | POA: Diagnosis not present

## 2023-09-02 DIAGNOSIS — M25431 Effusion, right wrist: Secondary | ICD-10-CM | POA: Diagnosis not present

## 2023-09-02 LAB — CBC WITH DIFFERENTIAL/PLATELET
Absolute Lymphocytes: 1854 {cells}/uL (ref 850–3900)
Absolute Monocytes: 744 {cells}/uL (ref 200–950)
Basophils Absolute: 78 {cells}/uL (ref 0–200)
Basophils Relative: 0.7 %
Eosinophils Absolute: 366 {cells}/uL (ref 15–500)
Eosinophils Relative: 3.3 %
HCT: 46 % — ABNORMAL HIGH (ref 35.0–45.0)
Hemoglobin: 15 g/dL (ref 11.7–15.5)
MCH: 28.7 pg (ref 27.0–33.0)
MCHC: 32.6 g/dL (ref 32.0–36.0)
MCV: 88 fL (ref 80.0–100.0)
MPV: 9.8 fL (ref 7.5–12.5)
Monocytes Relative: 6.7 %
Neutro Abs: 8059 {cells}/uL — ABNORMAL HIGH (ref 1500–7800)
Neutrophils Relative %: 72.6 %
Platelets: 277 Thousand/uL (ref 140–400)
RBC: 5.23 Million/uL — ABNORMAL HIGH (ref 3.80–5.10)
RDW: 13.2 % (ref 11.0–15.0)
Total Lymphocyte: 16.7 %
WBC: 11.1 Thousand/uL — ABNORMAL HIGH (ref 3.8–10.8)

## 2023-09-02 LAB — SEDIMENTATION RATE: Sed Rate: 6 mm/h (ref 0–30)

## 2023-09-02 LAB — C-REACTIVE PROTEIN: CRP: 9.9 mg/L — ABNORMAL HIGH (ref <8.0)

## 2023-09-02 LAB — URIC ACID: Uric Acid, Serum: 6.9 mg/dL (ref 2.5–7.0)

## 2023-09-03 DIAGNOSIS — M79672 Pain in left foot: Secondary | ICD-10-CM | POA: Diagnosis not present

## 2023-09-03 DIAGNOSIS — M79671 Pain in right foot: Secondary | ICD-10-CM | POA: Diagnosis not present

## 2023-09-03 NOTE — Telephone Encounter (Signed)
 Noted

## 2023-09-07 DIAGNOSIS — M109 Gout, unspecified: Secondary | ICD-10-CM | POA: Diagnosis not present

## 2023-09-08 ENCOUNTER — Telehealth: Payer: Self-pay

## 2023-09-08 NOTE — Telephone Encounter (Signed)
 Copied from CRM [id number]. Topic: General - Other >> Sep 07, 2023 12:34 PM Wess RAMAN wrote: Reason for CRM: [facility]) from [place] would like to know why patient is no longer taking Simvastatin  40 MG  Callback #: [phone] ext: [phone]

## 2023-09-08 NOTE — Telephone Encounter (Signed)
 Katherine White from [place] returned call her ext is [phone]. She states, vm is confidential so can leave message

## 2023-09-08 NOTE — Telephone Encounter (Signed)
 Spoke with Kayla, informed that I could not find much clinical documentation other than med was discontinued on 08/28/2022 by Kelly Cedar, FNP. Was not sure if med was given by cardiology but advised to reach out to their office to see.

## 2023-09-08 NOTE — Telephone Encounter (Signed)
 Left voicemail for Katherine White to return call.

## 2023-09-09 DIAGNOSIS — M79671 Pain in right foot: Secondary | ICD-10-CM | POA: Diagnosis not present

## 2023-09-09 DIAGNOSIS — M79672 Pain in left foot: Secondary | ICD-10-CM | POA: Diagnosis not present

## 2023-09-14 DIAGNOSIS — R0789 Other chest pain: Secondary | ICD-10-CM | POA: Diagnosis not present

## 2023-09-14 DIAGNOSIS — R0609 Other forms of dyspnea: Secondary | ICD-10-CM | POA: Diagnosis not present

## 2023-09-14 DIAGNOSIS — I2510 Atherosclerotic heart disease of native coronary artery without angina pectoris: Secondary | ICD-10-CM | POA: Diagnosis not present

## 2023-09-14 DIAGNOSIS — Z8679 Personal history of other diseases of the circulatory system: Secondary | ICD-10-CM | POA: Diagnosis not present

## 2023-09-15 DIAGNOSIS — M79672 Pain in left foot: Secondary | ICD-10-CM | POA: Diagnosis not present

## 2023-09-15 DIAGNOSIS — M79671 Pain in right foot: Secondary | ICD-10-CM | POA: Diagnosis not present

## 2023-09-17 ENCOUNTER — Ambulatory Visit
Admission: RE | Admit: 2023-09-17 | Discharge: 2023-09-17 | Disposition: A | Discharge status: Home / Self Care | Source: Ambulatory Visit | Attending: Family Medicine | Admitting: Family Medicine

## 2023-09-17 DIAGNOSIS — Z780 Asymptomatic menopausal state: Secondary | ICD-10-CM | POA: Insufficient documentation

## 2023-09-17 DIAGNOSIS — Z1231 Encounter for screening mammogram for malignant neoplasm of breast: Secondary | ICD-10-CM | POA: Diagnosis not present

## 2023-09-17 DIAGNOSIS — M8589 Other specified disorders of bone density and structure, multiple sites: Secondary | ICD-10-CM | POA: Diagnosis not present

## 2023-09-21 ENCOUNTER — Ambulatory Visit: Payer: Self-pay | Admitting: Family Medicine

## 2023-09-22 ENCOUNTER — Emergency Department

## 2023-09-22 ENCOUNTER — Emergency Department
Admission: EM | Admit: 2023-09-22 | Discharge: 2023-09-22 | Discharge status: Against Medical Advice | Attending: Emergency Medicine | Admitting: Emergency Medicine

## 2023-09-22 DIAGNOSIS — H9209 Otalgia, unspecified ear: Secondary | ICD-10-CM | POA: Diagnosis not present

## 2023-09-22 DIAGNOSIS — Z5321 Procedure and treatment not carried out due to patient leaving prior to being seen by health care provider: Secondary | ICD-10-CM | POA: Insufficient documentation

## 2023-09-22 DIAGNOSIS — R0789 Other chest pain: Secondary | ICD-10-CM | POA: Diagnosis not present

## 2023-09-22 DIAGNOSIS — R072 Precordial pain: Secondary | ICD-10-CM | POA: Diagnosis not present

## 2023-09-22 DIAGNOSIS — R079 Chest pain, unspecified: Secondary | ICD-10-CM | POA: Diagnosis not present

## 2023-09-22 LAB — CBC
HCT: 45.2 % (ref 36.0–46.0)
Hemoglobin: 15.1 g/dL — ABNORMAL HIGH (ref 12.0–15.0)
MCH: 29.4 pg (ref 26.0–34.0)
MCHC: 33.4 g/dL (ref 30.0–36.0)
MCV: 87.9 fL (ref 80.0–100.0)
Platelets: 235 K/uL (ref 150–400)
RBC: 5.14 MIL/uL — ABNORMAL HIGH (ref 3.87–5.11)
RDW: 14.2 % (ref 11.5–15.5)
WBC: 8.3 K/uL (ref 4.0–10.5)
nRBC: 0 % (ref 0.0–0.2)

## 2023-09-22 LAB — BASIC METABOLIC PANEL WITH GFR
Anion gap: 11 (ref 5–15)
BUN: 15 mg/dL (ref 8–23)
CO2: 20 mmol/L — ABNORMAL LOW (ref 22–32)
Calcium: 9.7 mg/dL (ref 8.9–10.3)
Chloride: 109 mmol/L (ref 98–111)
Creatinine, Ser: 0.74 mg/dL (ref 0.44–1.00)
GFR, Estimated: >60 mL/min (ref >60)
Glucose, Bld: 121 mg/dL — ABNORMAL HIGH (ref 70–99)
Potassium: 3.7 mmol/L (ref 3.5–5.1)
Sodium: 140 mmol/L (ref 135–145)

## 2023-09-22 LAB — TROPONIN I (HIGH SENSITIVITY): Troponin I (High Sensitivity): 3 ng/L (ref <18)

## 2023-09-22 NOTE — ED Triage Notes (Addendum)
 Pt presents to the ED POV from home with husband. Pt reports mid sternal chest pain x30 minutes that radiates up both sides of her neck and reports jaw pain. Pt reports taking nitroglycerin  around 1640 today. Pt reports possible mild improvement from nitroglycerin . Pt reports pain between her shoulder blades. Pt A&Ox4. Assessed by PA at time of triage. Pt reports similar sx's about 1 month ago. Pt does have gallstones

## 2023-09-22 NOTE — ED Provider Triage Note (Signed)
 Emergency Medicine Provider Triage Evaluation Note  Katherine White , a 68 y.o. female  was evaluated in triage.  Pt complains of chest pain that radiates to the left jaw left arm.  Patient endorses acute onset 3:00 in the morning.  She was here a month ago for same symptoms, she was diagnosed with gallstones.  Patient denies sore throat, cough, urinary symptoms.patient endorses having ear pain, sinus pain.  Review of Systems  Positive:  Negative:   Physical Exam  BP (!) 153/104   Pulse 90   Temp 98.4 F (36.9 C) (Oral)   Resp 18   Ht 5' (1.524 m)   Wt 73 kg   SpO2 100%   BMI 31.44 kg/m  Gen:   Awake, no distress in triage patient was hypertensive Resp:  Normal effor MSK:   Moves extremities without difficulty Other:    Medical Decision Making  Medically screening exam initiated at 5:05 PM.  Appropriate orders placed.  Lamarr Lavern Cyran was informed that the remainder of the evaluation will be completed by another provider, this initial triage assessment does not replace that evaluation, and the importance of remaining in the ED until their evaluation is complete.  Presents today with history of chest pain that started at 3:00 in the morning, pain radiates to the back left jaw, left arm.  There is CBC CMP chest x-ray EKG troponins   Janit Kast, PA-C 09/22/23 1707

## 2023-09-23 ENCOUNTER — Encounter: Payer: Self-pay | Admitting: Family Medicine

## 2023-09-23 DIAGNOSIS — E782 Mixed hyperlipidemia: Secondary | ICD-10-CM | POA: Diagnosis not present

## 2023-09-23 DIAGNOSIS — R0789 Other chest pain: Secondary | ICD-10-CM | POA: Diagnosis not present

## 2023-09-23 DIAGNOSIS — K8020 Calculus of gallbladder without cholecystitis without obstruction: Secondary | ICD-10-CM | POA: Diagnosis not present

## 2023-09-23 DIAGNOSIS — Z8679 Personal history of other diseases of the circulatory system: Secondary | ICD-10-CM | POA: Diagnosis not present

## 2023-09-29 DIAGNOSIS — M79672 Pain in left foot: Secondary | ICD-10-CM | POA: Diagnosis not present

## 2023-09-29 DIAGNOSIS — M79671 Pain in right foot: Secondary | ICD-10-CM | POA: Diagnosis not present

## 2023-10-05 DIAGNOSIS — K8020 Calculus of gallbladder without cholecystitis without obstruction: Secondary | ICD-10-CM | POA: Diagnosis not present

## 2023-10-05 NOTE — Progress Notes (Signed)
 Subjective:   CC: Gallstones [K80.20]  HPI:  referred by Alfredia Pleasant Bloch,* for evaluation of above CC.   History of Present Illness Katherine White is a 68 year old female with GERD who presents with chest pain.  She experiences intermittent epigastric chest pain, primarily postprandial, with two episodes in the last month. A CTA on July 14th revealed gallstones. She has not had further chest pain episodes since starting Protonix. Nitroglycerin  provided relief during the first episode but was ineffective during the second. She is on aspirin, does not take other blood thinners, and is not a smoker.   Past Medical History:  has a past medical history of Angina pectoris (), History of cardiomyopathy due to Takotsubo syndrome, Hypercholesterolemia, Myocardial infarction (CMS/HHS-HCC), Obesity, Osteoporosis, and Rosacea.  Past Surgical History:  has a past surgical history that includes Cardiac catheterization.  Family History: Family history is unknown by patient.  Social History:  reports that she has never smoked. She has never used smokeless tobacco. She reports that she does not drink alcohol and does not use drugs.  Current Medications: has a current medication list which includes the following prescription(s): alendronate , aspirin, b complex vitamins, celecoxib , cholecalciferol, fluticasone  propionate, herbal supplement, herbal supplement, multivit-minerals/folic acid, nitroglycerin , pantoprazole, and vit c/e/zinc ox/copp/lut/zeax.  Allergies:  Allergies as of 10/05/2023 - Reviewed 10/05/2023  Allergen Reaction Noted  . Aspirin Other (See Comments) 10/31/2013  . Codeine sulfate Unknown 10/31/2013    ROS:  A 15 point review of systems was performed and pertinent positives and negatives noted in HPI    Objective:     BP 110/72   Pulse 86   Ht 152.4 cm (5')   Wt 73.5 kg (162 lb)   BMI 31.64 kg/m    Constitutional :  No distress, cooperative, alert  Lymphatics/Throat:   Supple with no lymphadenopathy  Respiratory:  Clear to auscultation bilaterally  Cardiovascular:  Regular rate and rhythm  Gastrointestinal: Soft, non-tender, non-distended, no organomegaly.  Musculoskeletal: Steady gait and movement  Skin: Cool and moist  Psychiatric: Normal affect, non-agitated, not confused       LABS:  N/a   RADS: CLINICAL DATA:  Chest pain lasting for 15 minutes.   EXAM: CT ANGIOGRAPHY CHEST, ABDOMEN AND PELVIS   TECHNIQUE: Pre contrast imaging of the chest performed.   Multidetector CT imaging through the chest, abdomen and pelvis was performed using the standard protocol during bolus administration of intravenous contrast. Multiplanar reconstructed images and MIPs were obtained and reviewed to evaluate the vascular anatomy.   RADIATION DOSE REDUCTION: This exam was performed according to the departmental dose-optimization program which includes automated exposure control, adjustment of the mA and/or kV according to patient size and/or use of iterative reconstruction technique.   CONTRAST:  75mL OMNIPAQUE  IOHEXOL  350 MG/ML SOLN   COMPARISON:  Plain film of earlier today. Coronary CTA of 11/21/2021.   FINDINGS: CTA CHEST FINDINGS   Cardiovascular:  No intramural hematoma on noncontrast imaging.   Normal aortic caliber. No evidence of aortic aneurysm or dissection. Tortuous thoracic aorta. Aortic atherosclerosis. Mild cardiomegaly, without pericardial effusion. No central pulmonary embolism, on this non-dedicated study.   Mediastinum/Nodes: 1.0 cm right-sided thyroid  nodule. Not clinically significant; no follow-up imaging recommended (ref: J Am Coll Radiol. 2015 Feb;12(2): 143-50) No mediastinal or hilar adenopathy.   Lungs/Pleura: No pleural fluid. Minimal dependent subsegmental atelectasis in both lower lobes.   Musculoskeletal: Included within the abdomen pelvic section.   Review of the MIP images confirms the above findings.  CTA  ABDOMEN AND PELVIS FINDINGS   VASCULAR   Aorta: Normal abdominal aortic caliber, without dissection.   Celiac: Widely patent.   SMA: Widely patent.   Renals: Single renal arteries bilaterally.  No significant stenosis.   IMA: Patent   Inflow: Pelvic vasculature normal in caliber.   Veins: Poorly evaluated.   Review of the MIP images confirms the above findings.   NON-VASCULAR   Hepatobiliary: Normal liver. Multiple dependent gallstones without acute cholecystitis or biliary duct dilatation.   Pancreas: Normal, without mass or ductal dilatation.   Spleen: Normal in size, without focal abnormality.   Adrenals/Urinary Tract: Normal adrenal glands. Normal kidneys, without hydronephrosis. Normal urinary bladder.   Stomach/Bowel: Proximal gastric underdistention. Extensive colonic diverticulosis. Normal terminal ileum and appendix. Normal small bowel.   Lymphatic: No abdominopelvic adenopathy.   Reproductive: Normal uterus and adnexa.   Other: No significant free fluid. Mild pelvic floor laxity. No free intraperitoneal air. Dystrophic calcification in the subcutaneous fat of the right buttock region.   Musculoskeletal: Degenerative sclerosis of bilateral sacroiliac joints. Lower cervical and midthoracic spondylosis.   Diminutive twelfth ribs. Presuming a transitional L5 vertebral body, grade 1 L4-5 anterolisthesis with advanced degenerative disc disease.   Review of the MIP images confirms the above findings.   IMPRESSION: 1. No aortic dissection or explanation for chest pain. 2. Cholelithiasis 3.  Aortic Atherosclerosis (ICD10-I70.0).     Electronically Signed   By: Rockey Kilts M.D.   On: 08/24/2023 18:01  Assessment:      Gallstones [K80.20]  Plan:     1. Gallstones [K80.20]  atypical symptoms for gallstones so will proceed with HIDA scan to confirm if gallbladder is cause of previous symptoms   Office will call with results once  available.  labs/images/medications/previous chart entries reviewed personally and relevant changes/updates noted above.

## 2023-10-06 ENCOUNTER — Other Ambulatory Visit: Payer: Self-pay | Admitting: Surgery

## 2023-10-06 DIAGNOSIS — K828 Other specified diseases of gallbladder: Secondary | ICD-10-CM

## 2023-10-13 DIAGNOSIS — M79671 Pain in right foot: Secondary | ICD-10-CM | POA: Diagnosis not present

## 2023-10-13 DIAGNOSIS — M79672 Pain in left foot: Secondary | ICD-10-CM | POA: Diagnosis not present

## 2023-10-16 ENCOUNTER — Ambulatory Visit
Admission: RE | Admit: 2023-10-16 | Discharge: 2023-10-16 | Disposition: A | Discharge status: Home / Self Care | Source: Ambulatory Visit | Attending: Surgery | Admitting: Surgery

## 2023-10-16 DIAGNOSIS — K8020 Calculus of gallbladder without cholecystitis without obstruction: Secondary | ICD-10-CM | POA: Diagnosis not present

## 2023-10-16 DIAGNOSIS — K828 Other specified diseases of gallbladder: Secondary | ICD-10-CM | POA: Insufficient documentation

## 2023-10-16 DIAGNOSIS — R109 Unspecified abdominal pain: Secondary | ICD-10-CM | POA: Diagnosis not present

## 2023-10-16 MED ORDER — TECHNETIUM TC 99M MEBROFENIN IV KIT
5.0000 | PACK | Freq: Once | INTRAVENOUS | Status: AC | PRN
  Administered 2023-10-16: 5.33 via INTRAVENOUS

## 2023-10-19 ENCOUNTER — Ambulatory Visit: Payer: Self-pay | Admitting: Surgery

## 2023-10-19 ENCOUNTER — Other Ambulatory Visit

## 2023-10-19 NOTE — H&P (Signed)
 Subjective:    CC: Gallstones [K80.20]   HPI:  referred by Alfredia Pleasant Bloch,* for evaluation of above CC.    History of Present Illness Katherine White is a 68 year old female with GERD who presents with chest pain.   She experiences intermittent epigastric chest pain, primarily postprandial, with two episodes in the last month. A CTA on July 14th revealed gallstones. She has not had further chest pain episodes since starting Protonix. Nitroglycerin  provided relief during the first episode but was ineffective during the second. She is on aspirin, does not take other blood thinners, and is not a smoker.     Past Medical History:  has a past medical history of Angina pectoris (), History of cardiomyopathy due to Takotsubo syndrome, Hypercholesterolemia, Myocardial infarction (CMS/HHS-HCC), Obesity, Osteoporosis, and Rosacea.   Past Surgical History:  has a past surgical history that includes Cardiac catheterization.   Family History: Family history is unknown by patient.   Social History:  reports that she has never smoked. She has never used smokeless tobacco. She reports that she does not drink alcohol and does not use drugs.   Current Medications: has a current medication list which includes the following prescription(s): alendronate , aspirin, b complex vitamins, celecoxib , cholecalciferol, fluticasone  propionate, herbal supplement, herbal supplement, multivit-minerals/folic acid, nitroglycerin , pantoprazole, and vit c/e/zinc ox/copp/lut/zeax.   Allergies:       Allergies as of 10/05/2023 - Reviewed 10/05/2023  Allergen Reaction Noted   Aspirin Other (See Comments) 10/31/2013   Codeine sulfate Unknown 10/31/2013      ROS:  A 15 point review of systems was performed and pertinent positives and negatives noted in HPI    Objective:    Objective BP 110/72   Pulse 86   Ht 152.4 cm (5')   Wt 73.5 kg (162 lb)   BMI 31.64 kg/m      Constitutional :  No distress, cooperative,  alert  Lymphatics/Throat:  Supple with no lymphadenopathy  Respiratory:  Clear to auscultation bilaterally  Cardiovascular:  Regular rate and rhythm  Gastrointestinal: Soft, non-tender, non-distended, no organomegaly.  Musculoskeletal: Steady gait and movement  Skin: Cool and moist  Psychiatric: Normal affect, non-agitated, not confused         LABS:  N/a    RADS: CLINICAL DATA:  Chest pain lasting for 15 minutes.   EXAM: CT ANGIOGRAPHY CHEST, ABDOMEN AND PELVIS   TECHNIQUE: Pre contrast imaging of the chest performed.   Multidetector CT imaging through the chest, abdomen and pelvis was performed using the standard protocol during bolus administration of intravenous contrast. Multiplanar reconstructed images and MIPs were obtained and reviewed to evaluate the vascular anatomy.   RADIATION DOSE REDUCTION: This exam was performed according to the departmental dose-optimization program which includes automated exposure control, adjustment of the mA and/or kV according to patient size and/or use of iterative reconstruction technique.   CONTRAST:  75mL OMNIPAQUE  IOHEXOL  350 MG/ML SOLN   COMPARISON:  Plain film of earlier today. Coronary CTA of 11/21/2021.   FINDINGS: CTA CHEST FINDINGS   Cardiovascular:  No intramural hematoma on noncontrast imaging.   Normal aortic caliber. No evidence of aortic aneurysm or dissection. Tortuous thoracic aorta. Aortic atherosclerosis. Mild cardiomegaly, without pericardial effusion. No central pulmonary embolism, on this non-dedicated study.   Mediastinum/Nodes: 1.0 cm right-sided thyroid  nodule. Not clinically significant; no follow-up imaging recommended (ref: J Am Coll Radiol. 2015 Feb;12(2): 143-50) No mediastinal or hilar adenopathy.   Lungs/Pleura: No pleural fluid. Minimal dependent subsegmental atelectasis in  both lower lobes.   Musculoskeletal: Included within the abdomen pelvic section.   Review of the MIP images confirms  the above findings.   CTA ABDOMEN AND PELVIS FINDINGS   VASCULAR   Aorta: Normal abdominal aortic caliber, without dissection.   Celiac: Widely patent.   SMA: Widely patent.   Renals: Single renal arteries bilaterally.  No significant stenosis.   IMA: Patent   Inflow: Pelvic vasculature normal in caliber.   Veins: Poorly evaluated.   Review of the MIP images confirms the above findings.   NON-VASCULAR   Hepatobiliary: Normal liver. Multiple dependent gallstones without acute cholecystitis or biliary duct dilatation.   Pancreas: Normal, without mass or ductal dilatation.   Spleen: Normal in size, without focal abnormality.   Adrenals/Urinary Tract: Normal adrenal glands. Normal kidneys, without hydronephrosis. Normal urinary bladder.   Stomach/Bowel: Proximal gastric underdistention. Extensive colonic diverticulosis. Normal terminal ileum and appendix. Normal small bowel.   Lymphatic: No abdominopelvic adenopathy.   Reproductive: Normal uterus and adnexa.   Other: No significant free fluid. Mild pelvic floor laxity. No free intraperitoneal air. Dystrophic calcification in the subcutaneous fat of the right buttock region.   Musculoskeletal: Degenerative sclerosis of bilateral sacroiliac joints. Lower cervical and midthoracic spondylosis.   Diminutive twelfth ribs. Presuming a transitional L5 vertebral body, grade 1 L4-5 anterolisthesis with advanced degenerative disc disease.   Review of the MIP images confirms the above findings.   IMPRESSION: 1. No aortic dissection or explanation for chest pain. 2. Cholelithiasis 3.  Aortic Atherosclerosis (ICD10-I70.0).     Electronically Signed   By: Rockey Kilts M.D.   On: 08/24/2023 18:01   Assessment:    Assessment Gallstones [K80.20]   Plan:    Plan 1. Gallstones [K80.20]  atypical symptoms for gallstones so will proceed with HIDA scan to confirm if gallbladder is cause of previous symptoms    UPDATE:  HIDA positive so we will proceed with robotic assisted laparoscopic cholecystectomy.  Discussed the risk of surgery including post-op infxn, seroma, biloma, chronic pain, poor-delayed wound healing, retained gallstone, conversion to open procedure, post-op SBO or ileus, and need for additional procedures to address said risks.  The risks of general anesthetic including MI, CVA, sudden death or even reaction to anesthetic medications also discussed. Alternatives include continued observation.  Benefits include possible symptom relief, prevention of complications including acute cholecystitis, pancreatitis.  Typical post operative recovery of 3-5 days rest, continued pain in area and incision sites, possible loose stools up to 4-6 weeks, also discussed.  The patient understands the risks, any and all questions were answered to the patient's satisfaction.    labs/images/medications/previous chart entries reviewed personally and relevant changes/updates noted above.

## 2023-12-07 ENCOUNTER — Ambulatory Visit: Admitting: Family Medicine

## 2023-12-07 ENCOUNTER — Encounter: Payer: Self-pay | Admitting: Family Medicine

## 2023-12-07 VITALS — BP 131/79 | HR 82 | Ht 60.5 in | Wt 166.8 lb

## 2023-12-07 DIAGNOSIS — E559 Vitamin D deficiency, unspecified: Secondary | ICD-10-CM

## 2023-12-07 DIAGNOSIS — E7800 Pure hypercholesterolemia, unspecified: Secondary | ICD-10-CM

## 2023-12-07 DIAGNOSIS — T466X5A Adverse effect of antihyperlipidemic and antiarteriosclerotic drugs, initial encounter: Secondary | ICD-10-CM

## 2023-12-07 DIAGNOSIS — R739 Hyperglycemia, unspecified: Secondary | ICD-10-CM

## 2023-12-07 DIAGNOSIS — Z0000 Encounter for general adult medical examination without abnormal findings: Secondary | ICD-10-CM | POA: Diagnosis not present

## 2023-12-07 DIAGNOSIS — M791 Myalgia, unspecified site: Secondary | ICD-10-CM

## 2023-12-07 NOTE — Patient Instructions (Signed)
Consider asking at the pharmacy about the tetanus shot (TDAP) ?

## 2023-12-07 NOTE — Progress Notes (Signed)
 Complete physical exam   Patient: Katherine White   DOB: 07/13/1955   68 y.o. Female  MRN: [id number] Visit Date: 12/07/2023  Today's healthcare provider: Jon Eva, MD   Chief Complaint  Patient presents with   Annual Exam    Last completed 07/03/22 Diet -  No fried foods and low carb, attempts good protein with vegetables and fruit Exercise - 4 days a week if weather allows for at least 10 minutes and if walking around block 20 minutes Feeling - well Sleeping - cycles  Concerns - would like to see if she can have lipoprotein A checked as soon recently was checked and his was above 300    Subjective    Katherine White is a 68 y.o. female who presents today for a complete physical exam.    Discussed the use of AI scribe software for clinical note transcription with the patient, who gave verbal consent to proceed.  History of Present Illness   Katherine White is a 68 year old female who presents for an annual physical exam.  She experiences foot pain for the past three years, described as if the bottoms of her feet have fractures. Diagnosed with plantar fasciitis, she received cortisone injections and found physical therapy beneficial. Insoles provide significant relief, but foot issues limit her exercise ability.  She has high cholesterol and discontinued statins due to concerns about their impact on her feet and other bodily issues. She is unsure if stopping statins has affected her foot condition. Blood work was performed during gallbladder attacks, but she is uncertain about changes in cholesterol levels.  She is concerned about her weight and is considered prediabetic. Despite dietary and exercise changes, she finds it difficult to lose weight, possibly due to hormonal changes post-menopause. She has not taken hormone replacement therapy.  She inquires about the genetic implications of her granddaughter's diagnosis of Pitt Hopkins syndrome,  noting her granddaughter has never spoken and is learning to kneel.        Last depression screening scores    08/25/2023    8:29 AM 07/07/2023    9:38 AM 08/28/2022    2:50 PM  PHQ 2/9 Scores  PHQ - 2 Score 1 2 0  PHQ- 9 Score 4 3 0   Last fall risk screening    08/25/2023    8:29 AM  Fall Risk   Falls in the past year? 0  Number falls in past yr: 0  Injury with Fall? 0  Risk for fall due to : No Fall Risks        Medications: Outpatient Medications Prior to Visit  Medication Sig   alendronate  (FOSAMAX ) 70 MG tablet Take 70 mg by mouth once a week. Take with a full glass of water on an empty stomach.   aspirin 81 MG tablet Take 1 tablet by mouth daily.   celecoxib  (CELEBREX ) 100 MG capsule Take 1 capsule (100 mg total) by mouth 2 (two) times daily.   cholecalciferol (VITAMIN D3) 25 MCG (1000 UNIT) tablet Take 2,000 Units by mouth daily.   fluticasone  (FLONASE ) 50 MCG/ACT nasal spray Place 2 sprays into both nostrils daily.   Magnesium Citrate 100 MG TABS Take 1 tablet by mouth daily. Reported on 06/05/2015   Multiple Vitamins-Minerals (ONE DAILY CALCIUM/IRON PO) Take 1 capsule by mouth daily.   Multiple Vitamins-Minerals (VISION-VITE PRESERVE PO) Take by mouth.   nitroGLYCERIN  (NITROSTAT ) 0.4 MG SL tablet Place 1 tablet under the  tongue as needed. Reported on 06/05/2015   pantoprazole (PROTONIX) 40 MG tablet Take 40 mg by mouth daily.   No facility-administered medications prior to visit.    Review of Systems    Objective    BP 131/79 (BP Location: Left Arm, Patient Position: Sitting, Cuff Size: Normal)   Pulse 82   Ht 5' 0.5 (1.537 m)   Wt 166 lb 12.8 oz (75.7 kg)   SpO2 100%   BMI 32.04 kg/m    Physical Exam Vitals reviewed.  Constitutional:      General: She is not in acute distress.    Appearance: Normal appearance. She is well-developed. She is not diaphoretic.  HENT:     Head: Normocephalic and atraumatic.     Right Ear: Tympanic membrane, ear  canal and external ear normal.     Left Ear: Tympanic membrane, ear canal and external ear normal.     Nose: Nose normal.     Mouth/Throat:     Mouth: Mucous membranes are moist.     Pharynx: Oropharynx is clear. No oropharyngeal exudate.  Eyes:     General: No scleral icterus.    Conjunctiva/sclera: Conjunctivae normal.     Pupils: Pupils are equal, round, and reactive to light.  Neck:     Thyroid : No thyromegaly.  Cardiovascular:     Rate and Rhythm: Normal rate and regular rhythm.     Heart sounds: Normal heart sounds. No murmur heard. Pulmonary:     Effort: Pulmonary effort is normal. No respiratory distress.     Breath sounds: Normal breath sounds. No wheezing or rales.  Abdominal:     General: There is no distension.     Palpations: Abdomen is soft.     Tenderness: There is no abdominal tenderness.  Musculoskeletal:        General: No deformity.     Cervical back: Neck supple.     Right lower leg: No edema.     Left lower leg: No edema.  Lymphadenopathy:     Cervical: No cervical adenopathy.  Skin:    General: Skin is warm and dry.     Findings: No rash.  Neurological:     Mental Status: She is alert and oriented to person, place, and time. Mental status is at baseline.     Gait: Gait normal.  Psychiatric:        Mood and Affect: Mood normal.        Behavior: Behavior normal.        Thought Content: Thought content normal.      No results found for any visits on 12/07/23.  Assessment & Plan    Routine Health Maintenance and Physical Exam  Exercise Activities and Dietary recommendations  Goals      DIET - EAT MORE FRUITS AND VEGETABLES        Immunization History  Administered Date(s) Administered   Influenza Inj Mdck Quad Pf 12/03/2016, 02/02/2019   Influenza Split 10/23/2009, 11/21/2010   Influenza,inj,Quad PF,6+ Mos 11/09/2013, 11/17/2014, 03/15/2016   Influenza-Unspecified 12/03/2016, 02/02/2019   PFIZER(Purple Top)SARS-COV-2 Vaccination  05/11/2019, 06/01/2019   PNEUMOCOCCAL CONJUGATE-20 06/21/2021   Tdap 03/04/2006, 12/29/2013   Zoster Recombinant(Shingrix) 10/06/2017, 04/07/2018   Zoster, Live 04/10/2011    Health Maintenance  Topic Date Due   COVID-19 Vaccine (3 - Pfizer risk series) 06/29/2019   Influenza Vaccine  05/10/2024 (Originally 09/11/2023)   DTaP/Tdap/Td (3 - Td or Tdap) 12/30/2023   Medicare Annual Wellness (AWV)  07/06/2024   Mammogram  09/16/2024   DEXA SCAN  09/16/2025   Colonoscopy  10/09/2027   Pneumococcal Vaccine: 50+ Years  Completed   Hepatitis C Screening  Completed   Zoster Vaccines- Shingrix  Completed   Meningococcal B Vaccine  Aged Out    Discussed health benefits of physical activity, and encouraged her to engage in regular exercise appropriate for her age and condition.  Problem List Items Addressed This Visit       Other   Hypercholesterolemia without hypertriglyceridemia   Previously on statins since 2011 but discontinued due to concerns about side effects, including potential impact on foot pain. Discussed the possibility of resuming statins if cholesterol and lipoprotein A levels are elevated. Consideration of newer statins with fewer muscular side effects. - Order blood work to check cholesterol and lipoprotein A levels. - Discuss results and potential statin therapy after lab results are available.      Relevant Orders   Comprehensive metabolic panel with GFR   Lipid panel   Lipoprotein A (LPA)   Avitaminosis D   Relevant Orders   VITAMIN D  25 Hydroxy (Vit-D Deficiency, Fractures)   Other Visit Diagnoses       Encounter for annual physical exam    -  Primary   Relevant Orders   Hemoglobin A1c   Comprehensive metabolic panel with GFR   Lipid panel   VITAMIN D  25 Hydroxy (Vit-D Deficiency, Fractures)   Lipoprotein A (LPA)     Hyperglycemia       Relevant Orders   Hemoglobin A1c     Myalgia due to statin               Adult Wellness Visit Routine adult  wellness visit with no acute concerns. Discussed wellness and screening, including vaccinations and cancer screenings. - Order blood work including cholesterol, blood sugar, kidney and liver function, vitamin D , and lipoprotein A. - Remind to get TDAP vaccine at pharmacy next month. - Continue annual mammogram screenings. - Schedule next colonoscopy in 2029.  Hyperglycemia Discussed challenges with weight management and the impact of hormonal changes post-menopause. Medicare does not cover weight loss medications for prediabetes. - Consider referral to a weight loss clinic in [facility] for medical weight loss and counseling. - Discuss weight management strategies and potential referral with her husband.  Obesity Expresses desire to lose weight, noting difficulty despite dietary and exercise efforts. Discussed the impact of hormonal changes and the potential benefit of a structured weight loss program. - Provide information about a weight loss clinic in [place]. - Discuss potential referral to the clinic for weight management support.  Plantar fasciitis Chronic plantar fasciitis with previous cortisone injections and physical therapy. Reports improvement with physical therapy and use of insoles.  Arthritis of wrist Arthritis in the wrist with previous significant swelling.  Vitamin D  deficiency Vitamin D  levels to be checked as part of routine blood work. - Order blood work to check vitamin D  levels.        Return in about 1 year (around 12/06/2024) for CPE.     Jon Eva, MD  [facility] [phone] (phone) [phone] (fax)  [facility]

## 2023-12-07 NOTE — Assessment & Plan Note (Signed)
 Previously on statins since 2011 but discontinued due to concerns about side effects, including potential impact on foot pain. Discussed the possibility of resuming statins if cholesterol and lipoprotein A levels are elevated. Consideration of newer statins with fewer muscular side effects. - Order blood work to check cholesterol and lipoprotein A levels. - Discuss results and potential statin therapy after lab results are available.

## 2023-12-08 LAB — VITAMIN D 25 HYDROXY (VIT D DEFICIENCY, FRACTURES): Vit D, 25-Hydroxy: 40.3 ng/mL (ref 30.0–100.0)

## 2023-12-08 LAB — COMPREHENSIVE METABOLIC PANEL WITH GFR
ALT: 18 IU/L (ref 0–32)
AST: 20 IU/L (ref 0–40)
Albumin: 4.1 g/dL (ref 3.9–4.9)
Alkaline Phosphatase: 86 IU/L (ref 49–135)
BUN/Creatinine Ratio: 17 (ref 12–28)
BUN: 13 mg/dL (ref 8–27)
Bilirubin Total: 0.4 mg/dL (ref 0.0–1.2)
CO2: 20 mmol/L (ref 20–29)
Calcium: 10 mg/dL (ref 8.7–10.3)
Chloride: 106 mmol/L (ref 96–106)
Creatinine, Ser: 0.77 mg/dL (ref 0.57–1.00)
Globulin, Total: 2.1 g/dL (ref 1.5–4.5)
Glucose: 95 mg/dL (ref 70–99)
Potassium: 4.7 mmol/L (ref 3.5–5.2)
Sodium: 141 mmol/L (ref 134–144)
Total Protein: 6.2 g/dL (ref 6.0–8.5)
eGFR: 84 mL/min/1.73 (ref >59)

## 2023-12-08 LAB — LIPID PANEL
Chol/HDL Ratio: 6.5 ratio — ABNORMAL HIGH (ref 0.0–4.4)
Cholesterol, Total: 279 mg/dL — ABNORMAL HIGH (ref 100–199)
HDL: 43 mg/dL (ref >39)
LDL Chol Calc (NIH): 194 mg/dL — ABNORMAL HIGH (ref 0–99)
Triglycerides: 218 mg/dL — ABNORMAL HIGH (ref 0–149)
VLDL Cholesterol Cal: 42 mg/dL — ABNORMAL HIGH (ref 5–40)

## 2023-12-08 LAB — HEMOGLOBIN A1C
Est. average glucose Bld gHb Est-mCnc: 114 mg/dL
Hgb A1c MFr Bld: 5.6 % (ref 4.8–5.6)

## 2023-12-08 LAB — LIPOPROTEIN A (LPA): Lipoprotein (a): 281.3 nmol/L — ABNORMAL HIGH (ref <75.0)

## 2023-12-10 ENCOUNTER — Ambulatory Visit: Payer: Self-pay | Admitting: Family Medicine

## 2023-12-11 DIAGNOSIS — K080 Exfoliation of teeth due to systemic causes: Secondary | ICD-10-CM | POA: Diagnosis not present

## 2024-01-08 ENCOUNTER — Telehealth: Payer: Self-pay

## 2024-01-11 NOTE — Telephone Encounter (Signed)
 RAF gap of 0 - does not need anything

## 2024-01-25 DIAGNOSIS — H52223 Regular astigmatism, bilateral: Secondary | ICD-10-CM | POA: Diagnosis not present

## 2024-02-29 ENCOUNTER — Telehealth: Payer: Self-pay | Admitting: Family Medicine

## 2024-02-29 NOTE — Telephone Encounter (Unsigned)
 Copied from CRM [id number]. Topic: Clinical - Medical Advice >> Feb 29, 2024  1:18 PM Gustabo D wrote: Told pt the office said she isn't due for dexxascan until 2027.  Due to see Dr. Damian on April 28,2026 She wants to know what she needs to do bc they called her saying she needs the scan.

## 2024-03-01 NOTE — Telephone Encounter (Signed)
 Called and spoke with patient, patient understood and will call with any further questions

## 2024-03-01 NOTE — Telephone Encounter (Signed)
 She had DEXA in Aug 2025.  Looks like she is scheduled to see Dr Damian and for labs, not necessarily for a repeat scan. If they need the scan from 2025, they should be able to see it in [facility].  I recommend that she still see Dr Damian, but there may be some miscommunication about the DEXA.

## 2024-07-13 ENCOUNTER — Ambulatory Visit

## 2024-12-08 ENCOUNTER — Encounter: Admitting: Family Medicine
